# Patient Record
Sex: Female | Born: 1965 | Race: White | Hispanic: No | Marital: Married | Smoking: Never smoker
Health system: Southern US, Community
[De-identification: ages and names within clinical notes are randomized; demographics above are authoritative.]

## PROBLEM LIST (undated history)

## (undated) DIAGNOSIS — E041 Nontoxic single thyroid nodule: Secondary | ICD-10-CM

## (undated) HISTORY — PX: TONSILLECTOMY: SUR1361

## (undated) HISTORY — PX: COLONOSCOPY: SHX174

---

## 2013-06-22 ENCOUNTER — Other Ambulatory Visit (HOSPITAL_COMMUNITY)
Admission: RE | Admit: 2013-06-22 | Discharge: 2013-06-22 | Disposition: A | Discharge status: Home / Self Care | Payer: BC Managed Care – PPO | Source: Ambulatory Visit | Attending: Family Medicine | Admitting: Family Medicine

## 2013-06-22 DIAGNOSIS — Z1151 Encounter for screening for human papillomavirus (HPV): Secondary | ICD-10-CM | POA: Insufficient documentation

## 2013-06-22 DIAGNOSIS — Z124 Encounter for screening for malignant neoplasm of cervix: Secondary | ICD-10-CM | POA: Insufficient documentation

## 2013-06-27 ENCOUNTER — Other Ambulatory Visit: Payer: Self-pay

## 2013-06-27 DIAGNOSIS — Z1231 Encounter for screening mammogram for malignant neoplasm of breast: Secondary | ICD-10-CM

## 2013-07-05 ENCOUNTER — Ambulatory Visit
Admission: RE | Admit: 2013-07-05 | Discharge: 2013-07-05 | Disposition: A | Discharge status: Home / Self Care | Payer: BC Managed Care – PPO | Source: Ambulatory Visit

## 2013-07-05 DIAGNOSIS — Z1231 Encounter for screening mammogram for malignant neoplasm of breast: Secondary | ICD-10-CM

## 2013-07-07 ENCOUNTER — Other Ambulatory Visit: Payer: Self-pay | Admitting: Family Medicine

## 2013-07-07 DIAGNOSIS — R928 Other abnormal and inconclusive findings on diagnostic imaging of breast: Secondary | ICD-10-CM

## 2013-07-19 ENCOUNTER — Ambulatory Visit
Admission: RE | Admit: 2013-07-19 | Discharge: 2013-07-19 | Disposition: A | Discharge status: Home / Self Care | Payer: BC Managed Care – PPO | Source: Ambulatory Visit | Attending: Family Medicine | Admitting: Family Medicine

## 2013-07-19 ENCOUNTER — Encounter (INDEPENDENT_AMBULATORY_CARE_PROVIDER_SITE_OTHER): Payer: Self-pay

## 2013-07-19 ENCOUNTER — Ambulatory Visit
Admission: RE | Admit: 2013-07-19 | Discharge: 2013-07-19 | Disposition: A | Discharge status: Home / Self Care | Payer: Self-pay | Source: Ambulatory Visit | Attending: Family Medicine | Admitting: Family Medicine

## 2013-07-19 DIAGNOSIS — R928 Other abnormal and inconclusive findings on diagnostic imaging of breast: Secondary | ICD-10-CM

## 2013-12-19 ENCOUNTER — Other Ambulatory Visit: Payer: Self-pay | Admitting: Family Medicine

## 2013-12-19 DIAGNOSIS — N6489 Other specified disorders of breast: Secondary | ICD-10-CM

## 2014-01-23 ENCOUNTER — Ambulatory Visit
Admission: RE | Admit: 2014-01-23 | Discharge: 2014-01-23 | Disposition: A | Discharge status: Home / Self Care | Payer: 59 | Source: Ambulatory Visit | Attending: Family Medicine | Admitting: Family Medicine

## 2014-01-23 DIAGNOSIS — N6489 Other specified disorders of breast: Secondary | ICD-10-CM

## 2014-07-11 ENCOUNTER — Other Ambulatory Visit: Payer: Self-pay

## 2014-07-11 DIAGNOSIS — Z1231 Encounter for screening mammogram for malignant neoplasm of breast: Secondary | ICD-10-CM

## 2014-07-27 ENCOUNTER — Ambulatory Visit
Admission: RE | Admit: 2014-07-27 | Discharge: 2014-07-27 | Disposition: A | Discharge status: Home / Self Care | Payer: 59 | Source: Ambulatory Visit

## 2014-07-27 DIAGNOSIS — Z1231 Encounter for screening mammogram for malignant neoplasm of breast: Secondary | ICD-10-CM

## 2015-03-19 ENCOUNTER — Encounter: Payer: Self-pay | Admitting: Family Medicine

## 2015-03-19 ENCOUNTER — Ambulatory Visit (INDEPENDENT_AMBULATORY_CARE_PROVIDER_SITE_OTHER): Payer: Commercial Managed Care - HMO | Admitting: Family Medicine

## 2015-03-19 VITALS — BP 126/86 | HR 103 | Ht 66.0 in | Wt 218.0 lb

## 2015-03-19 DIAGNOSIS — M545 Low back pain, unspecified: Secondary | ICD-10-CM

## 2015-03-19 MED ORDER — METHOCARBAMOL 500 MG PO TABS: 500.0000 mg | ORAL_TABLET | Freq: Three times a day (TID) | ORAL | Status: DC | PRN

## 2015-03-19 NOTE — Patient Instructions (Signed)
While you have some SI joint tenderness I suspect the main problem is deep lumbar strain (erector spinae) based on your exam. Can take tylenol for baseline pain relief (1-2 extra strength tabs 3x/day) Aleve 2 tabs twice a day with food for pain and inflammation. Robaxin as needed for muscle spasms (no driving on this medicine if it makes you sleepy). Stay as active as possible. Physical therapy has been shown to be helpful as well - start this. Strengthening of low back muscles, abdominal musculature are key for long term pain relief. Ok to continue with your home stretches though if certain ones are too painful refrain from these for now. If not improving, will consider further imaging (MRI). Call me in 1 week to let me know how you're doing.

## 2015-03-20 DIAGNOSIS — M545 Low back pain, unspecified: Secondary | ICD-10-CM | POA: Insufficient documentation

## 2015-03-20 DIAGNOSIS — S39012A Strain of muscle, fascia and tendon of lower back, initial encounter: Secondary | ICD-10-CM | POA: Insufficient documentation

## 2015-03-20 NOTE — Progress Notes (Signed)
PCP: No primary care provider on file.  Subjective:   HPI: Patient is a 49 y.o. female here for low back pain.  Patient reports on 12/18 she started to get low back pain. No known injury or trauma. Has had very mild problems with low back for 20 years but not required any medical care for it. Felt tight on Saturday when she got a massage. Worsened since that time. Pain diffuse, right sided, sharp. Pain level 8/10 at worst. No skin changes, fever. No bowel/bladder dysfunction. No numbness, tingling.  No past medical history on file.  No current outpatient prescriptions on file prior to visit.   No current facility-administered medications on file prior to visit.    No past surgical history on file.  Allergies  Allergen Reactions  . Penicillins     Social History   Social History  . Marital Status: Married    Spouse Name: N/A  . Number of Children: N/A  . Years of Education: N/A   Occupational History  . Not on file.   Social History Main Topics  . Smoking status: Never Smoker   . Smokeless tobacco: Not on file  . Alcohol Use: Not on file  . Drug Use: Not on file  . Sexual Activity: Not on file   Other Topics Concern  . Not on file   Social History Narrative  . No narrative on file    No family history on file.  BP 126/86 mmHg  Pulse 103  Ht 5\' 6"  (1.676 m)  Wt 218 lb (98.884 kg)  BMI 35.20 kg/m2  Review of Systems: See HPI above.    Objective:  Physical Exam:  Gen: NAD  Back: No gross deformity, scoliosis. TTP right SI joint only. No paraspinal tenderness.  No midline or bony TTP. FROM with mild pain on extension > flexion. Strength LEs 5/5 all muscle groups.   2+ MSRs in patellar and achilles tendons, equal bilaterally. Negative SLRs. Sensation intact to light touch bilaterally.  Bilateral hips: Negative logroll bilateral hips Negative fabers and piriformis stretches.    Assessment & Plan:  1. Low back pain - No radiculopathic  symptoms.  No red flags.  Consistent with deep lumbar strain.  Less likely disc bulge, sprain.  Discussed tylenol, nsaids, start robaxin as needed.  Start physical therapy and home exercises.  Consider MRI if not improving.

## 2015-03-20 NOTE — Assessment & Plan Note (Signed)
No radiculopathic symptoms.  No red flags.  Consistent with deep lumbar strain.  Less likely disc bulge, sprain.  Discussed tylenol, nsaids, start robaxin as needed.  Start physical therapy and home exercises.  Consider MRI if not improving.

## 2015-06-19 ENCOUNTER — Other Ambulatory Visit: Payer: Self-pay

## 2015-06-19 DIAGNOSIS — Z1231 Encounter for screening mammogram for malignant neoplasm of breast: Secondary | ICD-10-CM

## 2015-08-01 ENCOUNTER — Ambulatory Visit: Payer: Commercial Managed Care - HMO

## 2015-08-20 ENCOUNTER — Ambulatory Visit
Admission: RE | Admit: 2015-08-20 | Discharge: 2015-08-20 | Disposition: A | Discharge status: Home / Self Care | Payer: Commercial Managed Care - HMO | Source: Ambulatory Visit

## 2015-08-20 DIAGNOSIS — Z1231 Encounter for screening mammogram for malignant neoplasm of breast: Secondary | ICD-10-CM

## 2016-07-14 ENCOUNTER — Other Ambulatory Visit: Payer: Self-pay | Admitting: Family Medicine

## 2016-07-14 DIAGNOSIS — Z1231 Encounter for screening mammogram for malignant neoplasm of breast: Secondary | ICD-10-CM

## 2016-08-20 ENCOUNTER — Ambulatory Visit: Payer: Commercial Managed Care - HMO

## 2016-09-09 ENCOUNTER — Ambulatory Visit
Admission: RE | Admit: 2016-09-09 | Discharge: 2016-09-09 | Disposition: A | Discharge status: Home / Self Care | Payer: Commercial Managed Care - HMO | Source: Ambulatory Visit | Attending: Family Medicine | Admitting: Family Medicine

## 2016-09-09 DIAGNOSIS — Z1231 Encounter for screening mammogram for malignant neoplasm of breast: Secondary | ICD-10-CM

## 2017-06-09 DIAGNOSIS — L570 Actinic keratosis: Secondary | ICD-10-CM | POA: Diagnosis not present

## 2017-06-09 DIAGNOSIS — D225 Melanocytic nevi of trunk: Secondary | ICD-10-CM | POA: Diagnosis not present

## 2017-06-09 DIAGNOSIS — L814 Other melanin hyperpigmentation: Secondary | ICD-10-CM | POA: Diagnosis not present

## 2017-06-09 DIAGNOSIS — D1801 Hemangioma of skin and subcutaneous tissue: Secondary | ICD-10-CM | POA: Diagnosis not present

## 2017-07-27 DIAGNOSIS — Z6836 Body mass index (BMI) 36.0-36.9, adult: Secondary | ICD-10-CM | POA: Diagnosis not present

## 2017-07-27 DIAGNOSIS — R946 Abnormal results of thyroid function studies: Secondary | ICD-10-CM | POA: Diagnosis not present

## 2017-07-27 DIAGNOSIS — Z800 Family history of malignant neoplasm of digestive organs: Secondary | ICD-10-CM | POA: Diagnosis not present

## 2017-07-27 DIAGNOSIS — J3089 Other allergic rhinitis: Secondary | ICD-10-CM | POA: Diagnosis not present

## 2017-07-27 DIAGNOSIS — Z131 Encounter for screening for diabetes mellitus: Secondary | ICD-10-CM | POA: Diagnosis not present

## 2017-07-27 DIAGNOSIS — Z0000 Encounter for general adult medical examination without abnormal findings: Secondary | ICD-10-CM | POA: Diagnosis not present

## 2017-08-03 ENCOUNTER — Other Ambulatory Visit: Payer: Self-pay | Admitting: Family Medicine

## 2017-08-03 DIAGNOSIS — Z1231 Encounter for screening mammogram for malignant neoplasm of breast: Secondary | ICD-10-CM

## 2017-09-15 ENCOUNTER — Ambulatory Visit
Admission: RE | Admit: 2017-09-15 | Discharge: 2017-09-15 | Disposition: A | Discharge status: Home / Self Care | Payer: BLUE CROSS/BLUE SHIELD | Source: Ambulatory Visit | Attending: Family Medicine | Admitting: Family Medicine

## 2017-09-15 DIAGNOSIS — Z1231 Encounter for screening mammogram for malignant neoplasm of breast: Secondary | ICD-10-CM | POA: Diagnosis not present

## 2018-08-08 ENCOUNTER — Other Ambulatory Visit: Payer: Self-pay | Admitting: Family Medicine

## 2018-08-08 DIAGNOSIS — Z1231 Encounter for screening mammogram for malignant neoplasm of breast: Secondary | ICD-10-CM

## 2018-09-29 ENCOUNTER — Other Ambulatory Visit: Payer: Self-pay

## 2018-09-29 ENCOUNTER — Ambulatory Visit
Admission: RE | Admit: 2018-09-29 | Discharge: 2018-09-29 | Disposition: A | Discharge status: Home / Self Care | Payer: 59 | Source: Ambulatory Visit | Attending: Family Medicine | Admitting: Family Medicine

## 2018-09-29 DIAGNOSIS — Z1231 Encounter for screening mammogram for malignant neoplasm of breast: Secondary | ICD-10-CM

## 2019-10-18 ENCOUNTER — Other Ambulatory Visit: Payer: Self-pay | Admitting: Obstetrics & Gynecology

## 2019-10-18 DIAGNOSIS — Z1231 Encounter for screening mammogram for malignant neoplasm of breast: Secondary | ICD-10-CM

## 2019-11-08 ENCOUNTER — Other Ambulatory Visit: Payer: Self-pay

## 2019-11-08 ENCOUNTER — Ambulatory Visit
Admission: RE | Admit: 2019-11-08 | Discharge: 2019-11-08 | Disposition: A | Discharge status: Home / Self Care | Payer: 59 | Source: Ambulatory Visit | Attending: Obstetrics & Gynecology | Admitting: Obstetrics & Gynecology

## 2019-11-08 DIAGNOSIS — Z1231 Encounter for screening mammogram for malignant neoplasm of breast: Secondary | ICD-10-CM

## 2020-09-27 ENCOUNTER — Other Ambulatory Visit: Payer: Self-pay | Admitting: Family Medicine

## 2020-09-27 DIAGNOSIS — Z1231 Encounter for screening mammogram for malignant neoplasm of breast: Secondary | ICD-10-CM

## 2020-11-11 ENCOUNTER — Other Ambulatory Visit: Payer: Self-pay | Admitting: Family Medicine

## 2020-11-11 DIAGNOSIS — R221 Localized swelling, mass and lump, neck: Secondary | ICD-10-CM

## 2020-11-13 ENCOUNTER — Ambulatory Visit (HOSPITAL_BASED_OUTPATIENT_CLINIC_OR_DEPARTMENT_OTHER): Admission: RE | Admit: 2020-11-13 | Payer: 59 | Source: Ambulatory Visit

## 2020-11-20 ENCOUNTER — Encounter (HOSPITAL_BASED_OUTPATIENT_CLINIC_OR_DEPARTMENT_OTHER): Payer: Self-pay

## 2020-11-20 ENCOUNTER — Ambulatory Visit (HOSPITAL_BASED_OUTPATIENT_CLINIC_OR_DEPARTMENT_OTHER)
Admission: RE | Admit: 2020-11-20 | Discharge: 2020-11-20 | Disposition: A | Discharge status: Home / Self Care | Payer: No Typology Code available for payment source | Source: Ambulatory Visit | Attending: Family Medicine | Admitting: Family Medicine

## 2020-11-20 ENCOUNTER — Other Ambulatory Visit: Payer: Self-pay

## 2020-11-20 DIAGNOSIS — R221 Localized swelling, mass and lump, neck: Secondary | ICD-10-CM

## 2020-11-20 MED ORDER — IOHEXOL 300 MG/ML  SOLN
75.0000 mL | Freq: Once | INTRAMUSCULAR | Status: AC | PRN
  Administered 2020-11-20: 75 mL via INTRAVENOUS

## 2020-11-26 ENCOUNTER — Other Ambulatory Visit (HOSPITAL_COMMUNITY): Payer: Self-pay | Admitting: Family Medicine

## 2020-11-26 DIAGNOSIS — E079 Disorder of thyroid, unspecified: Secondary | ICD-10-CM

## 2020-11-27 ENCOUNTER — Other Ambulatory Visit: Payer: Self-pay

## 2020-11-27 ENCOUNTER — Ambulatory Visit
Admission: RE | Admit: 2020-11-27 | Discharge: 2020-11-27 | Disposition: A | Discharge status: Home / Self Care | Payer: No Typology Code available for payment source | Source: Ambulatory Visit | Attending: Family Medicine | Admitting: Family Medicine

## 2020-11-27 DIAGNOSIS — Z1231 Encounter for screening mammogram for malignant neoplasm of breast: Secondary | ICD-10-CM

## 2020-12-04 ENCOUNTER — Other Ambulatory Visit: Payer: Self-pay

## 2020-12-04 ENCOUNTER — Ambulatory Visit (HOSPITAL_BASED_OUTPATIENT_CLINIC_OR_DEPARTMENT_OTHER)
Admission: RE | Admit: 2020-12-04 | Discharge: 2020-12-04 | Disposition: A | Discharge status: Home / Self Care | Payer: BC Managed Care – PPO | Source: Ambulatory Visit | Attending: Family Medicine | Admitting: Family Medicine

## 2020-12-04 DIAGNOSIS — E042 Nontoxic multinodular goiter: Secondary | ICD-10-CM | POA: Diagnosis not present

## 2020-12-04 DIAGNOSIS — E079 Disorder of thyroid, unspecified: Secondary | ICD-10-CM | POA: Diagnosis not present

## 2020-12-11 DIAGNOSIS — Z01419 Encounter for gynecological examination (general) (routine) without abnormal findings: Secondary | ICD-10-CM | POA: Diagnosis not present

## 2020-12-11 DIAGNOSIS — N952 Postmenopausal atrophic vaginitis: Secondary | ICD-10-CM | POA: Diagnosis not present

## 2020-12-11 DIAGNOSIS — Z6836 Body mass index (BMI) 36.0-36.9, adult: Secondary | ICD-10-CM | POA: Diagnosis not present

## 2021-01-03 ENCOUNTER — Encounter (HOSPITAL_COMMUNITY): Payer: Self-pay | Admitting: Radiology

## 2021-01-09 ENCOUNTER — Other Ambulatory Visit: Payer: Self-pay | Admitting: Family Medicine

## 2021-01-09 DIAGNOSIS — E041 Nontoxic single thyroid nodule: Secondary | ICD-10-CM

## 2021-01-30 ENCOUNTER — Ambulatory Visit
Admission: RE | Admit: 2021-01-30 | Discharge: 2021-01-30 | Disposition: A | Discharge status: Home / Self Care | Payer: BC Managed Care – PPO | Source: Ambulatory Visit | Attending: Family Medicine | Admitting: Family Medicine

## 2021-01-30 ENCOUNTER — Other Ambulatory Visit (HOSPITAL_COMMUNITY)
Admission: RE | Admit: 2021-01-30 | Discharge: 2021-01-30 | Disposition: A | Discharge status: Home / Self Care | Payer: BC Managed Care – PPO | Source: Ambulatory Visit | Attending: Family Medicine | Admitting: Family Medicine

## 2021-01-30 DIAGNOSIS — D34 Benign neoplasm of thyroid gland: Secondary | ICD-10-CM | POA: Diagnosis not present

## 2021-01-30 DIAGNOSIS — E042 Nontoxic multinodular goiter: Secondary | ICD-10-CM | POA: Diagnosis not present

## 2021-01-30 DIAGNOSIS — E041 Nontoxic single thyroid nodule: Secondary | ICD-10-CM | POA: Diagnosis not present

## 2021-01-30 DIAGNOSIS — D440 Neoplasm of uncertain behavior of thyroid gland: Secondary | ICD-10-CM | POA: Diagnosis not present

## 2021-01-31 LAB — CYTOLOGY - NON PAP

## 2021-02-04 ENCOUNTER — Other Ambulatory Visit: Payer: Self-pay | Admitting: Radiology

## 2021-03-10 DIAGNOSIS — D123 Benign neoplasm of transverse colon: Secondary | ICD-10-CM | POA: Diagnosis not present

## 2021-03-10 DIAGNOSIS — Z86010 Personal history of colonic polyps: Secondary | ICD-10-CM | POA: Diagnosis not present

## 2021-03-10 DIAGNOSIS — K5730 Diverticulosis of large intestine without perforation or abscess without bleeding: Secondary | ICD-10-CM | POA: Diagnosis not present

## 2021-03-10 DIAGNOSIS — D122 Benign neoplasm of ascending colon: Secondary | ICD-10-CM | POA: Diagnosis not present

## 2021-03-12 ENCOUNTER — Ambulatory Visit: Payer: Self-pay | Admitting: Surgery

## 2021-03-12 DIAGNOSIS — D440 Neoplasm of uncertain behavior of thyroid gland: Secondary | ICD-10-CM | POA: Diagnosis not present

## 2021-03-12 DIAGNOSIS — E042 Nontoxic multinodular goiter: Secondary | ICD-10-CM | POA: Diagnosis not present

## 2021-04-01 NOTE — Patient Instructions (Addendum)
DUE TO COVID-19 ONLY ONE VISITOR IS ALLOWED TO COME WITH YOU AND STAY IN THE WAITING ROOM ONLY DURING PRE OP AND PROCEDURE.   **NO VISITORS ARE ALLOWED IN THE SHORT STAY AREA OR RECOVERY ROOM!!**  IF YOU WILL BE ADMITTED INTO THE HOSPITAL YOU ARE ALLOWED ONLY TWO SUPPORT PEOPLE DURING VISITATION HOURS ONLY (10AM -8PM)   The support person(s) may change daily. The support person(s) must pass our screening, gel in and out, and wear a mask at all times, including in the patients room. Patients must also wear a mask when staff or their support person are in the room.  No visitors under the age of 89. Any visitor under the age of 66 must be accompanied by an adult.    COVID SWAB TESTING MUST BE COMPLETED ON:  04/02/21 @ 8:45AM  You are not required to quarantine, however you are required to wear a well-fitted mask when you are out and around people not in your household.  Hand Hygiene often Do NOT share personal items Notify your provider if you are in close contact with someone who has COVID or you develop fever 100.4 or greater, new onset of sneezing, cough, sore throat, shortness of breath or body aches.   Your procedure is scheduled on: 04/04/21   Report to [facility] Main Entrance    Report to admitting at 7:15 AM   Call this number if you have problems the morning of surgery [phone]   Do not eat food :After Midnight.   May have liquids until 6:30 AM day of surgery  CLEAR LIQUID DIET  Foods Allowed                                                                     Foods Excluded  Water, Black Coffee and tea (no milk or creamer)           liquids that you cannot  Plain Jell-O in any flavor  (No red)                                    see through such as: Fruit ices (not with fruit pulp)                                            milk, soups, orange juice              Iced Popsicles (No red)                                                All solid food                                    Apple juices     Sports drinks like Gatorade (No red) Lightly seasoned clear broth or consume(fat free) Sugar   Oral Hygiene is  also important to reduce your risk of infection.                                    Remember - BRUSH YOUR TEETH THE MORNING OF SURGERY WITH YOUR REGULAR TOOTHPASTE   Take these medicines the morning of surgery with A SIP OF WATER: Claritin                               You may not have any metal on your body including jewelry, and body piercing             Do not wear make-up, lotions, powders, perfumes, or deodorant  Do not wear nail polish including gel and S&S, artificial/acrylic nails, or any other type of covering on natural nails including finger and toenails. If you have artificial nails, gel coating, etc. that needs to be removed by a nail salon please have this removed prior to surgery or surgery may need to be canceled/ delayed if the surgeon/ anesthesia feels like they are unable to be safely monitored.   Do not shave  48 hours prior to surgery.    Do not bring valuables to the hospital. [place].   Contacts, dentures or bridgework may not be worn into surgery.   Bring small overnight bag day of surgery.   Special Instructions: Bring a copy of your healthcare power of attorney and living will documents         the day of surgery if you haven't scanned them before.              Please read over the following fact sheets you were given: IF YOU HAVE QUESTIONS ABOUT YOUR PRE-OP INSTRUCTIONS PLEASE CALL [facility] - Preparing for Surgery Before surgery, you can play an important role.  Because skin is not sterile, your skin needs to be as free of germs as possible.  You can reduce the number of germs on your skin by washing with CHG (chlorahexidine gluconate) soap before surgery.  CHG is an antiseptic cleaner which kills germs and bonds with the skin to continue  killing germs even after washing. Please DO NOT use if you have an allergy to CHG or antibacterial soaps.  If your skin becomes reddened/irritated stop using the CHG and inform your nurse when you arrive at Short Stay. Do not shave (including legs and underarms) for at least 48 hours prior to the first CHG shower.  You may shave your face/neck.  Please follow these instructions carefully:  1.  Shower with CHG Soap the night before surgery and the  morning of surgery.  2.  If you choose to wash your hair, wash your hair first as usual with your normal  shampoo.  3.  After you shampoo, rinse your hair and body thoroughly to remove the shampoo.                             4.  Use CHG as you would any other liquid soap.  You can apply chg directly to the skin and wash.  Gently with a scrungie or clean washcloth.  5.  Apply the CHG Soap to your body  ONLY FROM THE NECK DOWN.   Do   not use on face/ open                           Wound or open sores. Avoid contact with eyes, ears mouth and   genitals (private parts).                       Wash face,  Genitals (private parts) with your normal soap.             6.  Wash thoroughly, paying special attention to the area where your    surgery  will be performed.  7.  Thoroughly rinse your body with warm water from the neck down.  8.  DO NOT shower/wash with your normal soap after using and rinsing off the CHG Soap.                9.  Pat yourself dry with a clean towel.            10.  Wear clean pajamas.            11.  Place clean sheets on your bed the night of your first shower and do not  sleep with pets. Day of Surgery : Do not apply any lotions/deodorants the morning of surgery.  Please wear clean clothes to the hospital/surgery center.  FAILURE TO FOLLOW THESE INSTRUCTIONS MAY RESULT IN THE CANCELLATION OF YOUR SURGERY  PATIENT SIGNATURE_________________________________  NURSE  SIGNATURE__________________________________  ________________________________________________________________________

## 2021-04-01 NOTE — Progress Notes (Addendum)
COVID swab appointment: 04/02/21 @ 0845  COVID Vaccine Completed: yes x3 Date COVID Vaccine completed: Has received booster: COVID vaccine manufacturer: [employer]   Date of COVID positive in last 90 days: no  PCP - Kathyrn Lass, MD Cardiologist - n/a  Chest x-ray - 04/02/21 Epic EKG - n/a Stress Test -  n/a ECHO - n/a Cardiac Cath - n/a Pacemaker/ICD device last checked: n/a Spinal Cord Stimulator: n/a  Sleep Study - n/a CPAP -   Fasting Blood Sugar - n/a Checks Blood Sugar _____ times a day  Blood Thinner Instructions: n/a Aspirin Instructions: Last Dose:  Activity level: Can go up a flight of stairs and perform activities of daily living without stopping and without symptoms of chest pain or shortness of breath.       Anesthesia review:   Patient denies shortness of breath, fever, cough and chest pain at PAT appointment   Patient verbalized understanding of instructions that were given to them at the PAT appointment. Patient was also instructed that they will need to review over the PAT instructions again at home before surgery.

## 2021-04-02 ENCOUNTER — Encounter (HOSPITAL_COMMUNITY): Payer: Self-pay

## 2021-04-02 ENCOUNTER — Other Ambulatory Visit: Payer: Self-pay

## 2021-04-02 ENCOUNTER — Ambulatory Visit (HOSPITAL_COMMUNITY)
Admission: RE | Admit: 2021-04-02 | Discharge: 2021-04-02 | Disposition: A | Discharge status: Home / Self Care | Payer: BC Managed Care – PPO | Source: Ambulatory Visit | Attending: Anesthesiology | Admitting: Anesthesiology

## 2021-04-02 ENCOUNTER — Encounter (HOSPITAL_COMMUNITY): Payer: Self-pay | Admitting: Surgery

## 2021-04-02 ENCOUNTER — Encounter (HOSPITAL_COMMUNITY)
Admission: RE | Admit: 2021-04-02 | Discharge: 2021-04-02 | Disposition: A | Discharge status: Home / Self Care | Payer: BC Managed Care – PPO | Source: Ambulatory Visit | Attending: Surgery | Admitting: Surgery

## 2021-04-02 VITALS — BP 134/91 | HR 97 | Temp 98.5°F | Resp 18 | Ht 66.0 in | Wt 225.0 lb

## 2021-04-02 DIAGNOSIS — Z01818 Encounter for other preprocedural examination: Secondary | ICD-10-CM

## 2021-04-02 DIAGNOSIS — D440 Neoplasm of uncertain behavior of thyroid gland: Secondary | ICD-10-CM | POA: Diagnosis present

## 2021-04-02 DIAGNOSIS — Z20822 Contact with and (suspected) exposure to covid-19: Secondary | ICD-10-CM | POA: Diagnosis not present

## 2021-04-02 DIAGNOSIS — E042 Nontoxic multinodular goiter: Secondary | ICD-10-CM | POA: Diagnosis present

## 2021-04-02 HISTORY — DX: Nontoxic single thyroid nodule: E04.1

## 2021-04-02 LAB — SARS CORONAVIRUS 2 (TAT 6-24 HRS): SARS Coronavirus 2: NEGATIVE

## 2021-04-02 LAB — CBC
HCT: 40.3 % (ref 36.0–46.0)
Hemoglobin: 14.2 g/dL (ref 12.0–15.0)
MCH: 32 pg (ref 26.0–34.0)
MCHC: 35.2 g/dL (ref 30.0–36.0)
MCV: 90.8 fL (ref 80.0–100.0)
Platelets: 324 10*3/uL (ref 150–400)
RBC: 4.44 MIL/uL (ref 3.87–5.11)
RDW: 13.2 % (ref 11.5–15.5)
WBC: 9.2 10*3/uL (ref 4.0–10.5)
nRBC: 0 % (ref 0.0–0.2)

## 2021-04-02 NOTE — H&P (Signed)
REFERRING PHYSICIAN: Kathyrn Lass  PROVIDER: Dixie Jafri Charlotta Newton, MD  Chief Complaint: New Consultation (Thyroid neoplasm of uncertain behavior, multiple thyroid nodules)   History of Present Illness:  Patient is referred by Dr. 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 Patient had noted in July 2020 to a mass in the left anterior neck on self-examination. She presented to her primary care physician and was sent for CT scan. This demonstrated multiple thyroid nodules. Patient underwent thyroid ultrasound on December 04, 2020. Bilateral thyroid nodules were noted. Several of these nodules were felt to be moderately suspicious. Fine-needle aspiration biopsies were performed on January 30, 2021. The dominant nodule on the left side measuring 3.3 cm was read as suspicious for malignancy, Bethesda category V. Patient has no prior history of thyroid disease. She had never been on thyroid medication. She has had no prior head or neck surgery. There is a family history of thyroid disease in the patient's mother who underwent surgery for goiter. There is no family history of other endocrine neoplasms. Patient works as a Marine scientist in [employer].  Review of Systems: A complete review of systems was obtained from the patient. I have reviewed this information and discussed as appropriate with the patient. See HPI as well for other ROS.  Review of Systems  Constitutional: Negative.  HENT: Negative.  Eyes: Negative.  Respiratory: Negative.  Cardiovascular: Negative.  Gastrointestinal: Negative.  Genitourinary: Negative.  Musculoskeletal: Negative.  Skin: Negative.  Neurological: Negative.  Endo/Heme/Allergies: Negative.  Psychiatric/Behavioral: Negative.    Medical History: No past medical history on file.  Patient Active Problem List  Diagnosis   Neoplasm of uncertain behavior of  thyroid gland   Multiple thyroid nodules   No past surgical history on file.   Not on File  No current outpatient medications on file prior to visit.   No current facility-administered medications on file prior to visit.   No family history on file.   Social History   Tobacco Use  Smoking Status Not on file  Smokeless Tobacco Not on file    Social History   Socioeconomic History   Marital status: Married   Objective:   There were no vitals filed for this visit.  There is no height or weight on file to calculate BMI.  Physical Exam   GENERAL APPEARANCE Development: normal Nutritional status: normal Gross deformities: none  SKIN Rash, lesions, ulcers: none Induration, erythema: none Nodules: none palpable  EYES Conjunctiva and lids: normal Pupils: equal and reactive Iris: normal bilaterally  EARS, NOSE, MOUTH, THROAT External ears: no lesion or deformity External nose: no lesion or deformity Hearing: grossly normal Due to Covid-19 pandemic, patient is wearing a mask.  NECK Symmetric: yes Trachea: midline Thyroid: There is a palpable 2 cm smooth nodule in the thyroid isthmus which is mobile. There is no palpable nodularity in the right thyroid lobe. Dominant nodule on the left side is palpable low in the left neck and extending beneath the left clavicle. There is no associated lymphadenopathy.  CHEST Respiratory effort: normal Retraction or accessory muscle use: no Breath sounds: normal bilaterally Rales, rhonchi, wheeze: none  CARDIOVASCULAR Auscultation: regular rhythm, normal rate Murmurs: none Pulses: radial pulse 2+ palpable Lower extremity edema: none  MUSCULOSKELETAL Station and gait: normal Digits and nails: no clubbing or cyanosis Muscle strength: grossly normal all extremities Range of motion: grossly normal all extremities Deformity: none  LYMPHATIC  Cervical: none palpable Supraclavicular: none palpable  PSYCHIATRIC Oriented to  person, place, and time: yes Mood and affect: normal for situation Judgment and insight: appropriate for situation  Assessment and Plan:   Neoplasm of uncertain behavior of thyroid gland  Multiple thyroid nodules    Patient presents today on referral from Dr. Kathyrn Lass for surgical evaluation and management of multiple thyroid nodules and a neoplasm of uncertain behavior of the thyroid gland which is highly suspicious for malignancy.  Patient provided with a copy of "The Thyroid Book: Medical and Surgical Treatment of Thyroid Problems", published by [employer], 16 pages. Book reviewed and explained to patient during visit today.  Today we discussed management. Based on bilateral thyroid nodules and the presence of a highly suspicious nodule with approximately a 90% chance of malignancy, I have recommended proceeding with total thyroidectomy. We discussed the procedure at length. We discussed the risk of benefits including the risk of recurrent laryngeal nerve injury and injury to parathyroid glands. We discussed the size and location of the surgical incision. We discussed the hospital stay to be anticipated. We discussed the postoperative recovery and return to work and activities. We discussed the need for lifelong thyroid hormone replacement. We discussed the potential need for consultation with endocrinology and radioactive iodine treatment. The patient understands and wishes to proceed with surgery in the near future.  The risks and benefits of the procedure have been discussed at length with the patient. The patient understands the proposed procedure, potential alternative treatments, and the course of recovery to be expected. All of the patient's questions have been answered at this time. The patient wishes to proceed with surgery.   Armandina Gemma, MD [facility] [place] practice Office: [phone]

## 2021-04-03 ENCOUNTER — Encounter (HOSPITAL_COMMUNITY): Payer: Self-pay | Admitting: Surgery

## 2021-04-04 ENCOUNTER — Encounter (HOSPITAL_COMMUNITY): Payer: Self-pay | Admitting: Surgery

## 2021-04-04 ENCOUNTER — Other Ambulatory Visit: Payer: Self-pay

## 2021-04-04 ENCOUNTER — Ambulatory Visit (HOSPITAL_COMMUNITY): Payer: BC Managed Care – PPO | Admitting: Anesthesiology

## 2021-04-04 ENCOUNTER — Ambulatory Visit (HOSPITAL_COMMUNITY)
Admission: RE | Admit: 2021-04-04 | Discharge: 2021-04-05 | Disposition: A | Discharge status: Home / Self Care | Payer: BC Managed Care – PPO | Attending: Surgery | Admitting: Surgery

## 2021-04-04 ENCOUNTER — Encounter (HOSPITAL_COMMUNITY)
Admission: RE | Disposition: A | Discharge status: Home / Self Care | Payer: Self-pay | Source: Home / Self Care | Attending: Surgery

## 2021-04-04 DIAGNOSIS — D34 Benign neoplasm of thyroid gland: Secondary | ICD-10-CM | POA: Insufficient documentation

## 2021-04-04 DIAGNOSIS — E041 Nontoxic single thyroid nodule: Secondary | ICD-10-CM | POA: Diagnosis not present

## 2021-04-04 DIAGNOSIS — E042 Nontoxic multinodular goiter: Secondary | ICD-10-CM | POA: Insufficient documentation

## 2021-04-04 DIAGNOSIS — D440 Neoplasm of uncertain behavior of thyroid gland: Secondary | ICD-10-CM | POA: Diagnosis not present

## 2021-04-04 DIAGNOSIS — E669 Obesity, unspecified: Secondary | ICD-10-CM | POA: Insufficient documentation

## 2021-04-04 DIAGNOSIS — Z6836 Body mass index (BMI) 36.0-36.9, adult: Secondary | ICD-10-CM | POA: Diagnosis not present

## 2021-04-04 HISTORY — PX: THYROIDECTOMY: SHX17

## 2021-04-04 SURGERY — THYROIDECTOMY
Anesthesia: General

## 2021-04-04 MED ORDER — IBUPROFEN 400 MG PO TABS: 600.0000 mg | ORAL_TABLET | Freq: Four times a day (QID) | ORAL | Status: DC | PRN

## 2021-04-04 MED ORDER — HEMOSTATIC AGENTS (NO CHARGE) OPTIME
TOPICAL | Status: DC | PRN
  Administered 2021-04-04: 1 via TOPICAL

## 2021-04-04 MED ORDER — ACETAMINOPHEN 650 MG RE SUPP: 650.0000 mg | Freq: Four times a day (QID) | RECTAL | Status: DC | PRN

## 2021-04-04 MED ORDER — ONDANSETRON HCL 4 MG/2ML IJ SOLN
INTRAMUSCULAR | Status: DC | PRN
Start: 2021-04-04 — End: 2021-04-04
  Administered 2021-04-04: 4 mg via INTRAVENOUS

## 2021-04-04 MED ORDER — ACETAMINOPHEN 500 MG PO TABS
1000.0000 mg | ORAL_TABLET | Freq: Once | ORAL | Status: AC
  Administered 2021-04-04: 1000 mg via ORAL
  Filled 2021-04-04: qty 2

## 2021-04-04 MED ORDER — ROCURONIUM BROMIDE 10 MG/ML (PF) SYRINGE
PREFILLED_SYRINGE | INTRAVENOUS | Status: DC | PRN
  Administered 2021-04-04: 70 mg via INTRAVENOUS
  Administered 2021-04-04: 10 mg via INTRAVENOUS

## 2021-04-04 MED ORDER — SUGAMMADEX SODIUM 500 MG/5ML IV SOLN
INTRAVENOUS | Status: DC | PRN
  Administered 2021-04-04: 25 mg via INTRAVENOUS

## 2021-04-04 MED ORDER — PROPOFOL 10 MG/ML IV BOLUS
INTRAVENOUS | Status: DC | PRN
  Administered 2021-04-04: 150 mg via INTRAVENOUS

## 2021-04-04 MED ORDER — SUGAMMADEX SODIUM 500 MG/5ML IV SOLN
INTRAVENOUS | Status: AC
  Filled 2021-04-04: qty 5

## 2021-04-04 MED ORDER — LACTATED RINGERS IV SOLN
INTRAVENOUS | Status: DC
Start: 2021-04-04 — End: 2021-04-04

## 2021-04-04 MED ORDER — ONDANSETRON 4 MG PO TBDP: 4.0000 mg | ORAL_TABLET | Freq: Four times a day (QID) | ORAL | Status: DC | PRN

## 2021-04-04 MED ORDER — TRAMADOL HCL 50 MG PO TABS: 50.0000 mg | ORAL_TABLET | Freq: Four times a day (QID) | ORAL | Status: DC | PRN

## 2021-04-04 MED ORDER — ONDANSETRON HCL 4 MG/2ML IJ SOLN
INTRAMUSCULAR | Status: AC
  Filled 2021-04-04: qty 2

## 2021-04-04 MED ORDER — MIDAZOLAM HCL 2 MG/2ML IJ SOLN
INTRAMUSCULAR | Status: AC
  Filled 2021-04-04: qty 2

## 2021-04-04 MED ORDER — CHLORHEXIDINE GLUCONATE CLOTH 2 % EX PADS: 6.0000 | MEDICATED_PAD | Freq: Once | CUTANEOUS | Status: DC

## 2021-04-04 MED ORDER — OXYCODONE HCL 5 MG PO TABS: 5.0000 mg | ORAL_TABLET | Freq: Once | ORAL | Status: DC | PRN

## 2021-04-04 MED ORDER — CHLORHEXIDINE GLUCONATE 0.12 % MT SOLN
15.0000 mL | Freq: Once | OROMUCOSAL | Status: AC
  Administered 2021-04-04: 15 mL via OROMUCOSAL

## 2021-04-04 MED ORDER — DEXAMETHASONE SODIUM PHOSPHATE 10 MG/ML IJ SOLN
INTRAMUSCULAR | Status: AC
  Filled 2021-04-04: qty 1

## 2021-04-04 MED ORDER — SODIUM CHLORIDE 0.45 % IV SOLN
INTRAVENOUS | Status: DC
Start: 2021-04-04 — End: 2021-04-05

## 2021-04-04 MED ORDER — OXYCODONE HCL 5 MG/5ML PO SOLN: 5.0000 mg | Freq: Once | ORAL | Status: DC | PRN

## 2021-04-04 MED ORDER — ACETAMINOPHEN 325 MG PO TABS
650.0000 mg | ORAL_TABLET | Freq: Four times a day (QID) | ORAL | Status: DC | PRN
  Administered 2021-04-04 – 2021-04-05 (×3): 650 mg via ORAL
  Filled 2021-04-04 (×3): qty 2

## 2021-04-04 MED ORDER — AMISULPRIDE (ANTIEMETIC) 5 MG/2ML IV SOLN: 10.0000 mg | Freq: Once | INTRAVENOUS | Status: DC | PRN

## 2021-04-04 MED ORDER — MIDAZOLAM HCL 5 MG/5ML IJ SOLN
INTRAMUSCULAR | Status: DC | PRN
  Administered 2021-04-04: 2 mg via INTRAVENOUS

## 2021-04-04 MED ORDER — FENTANYL CITRATE PF 50 MCG/ML IJ SOSY
PREFILLED_SYRINGE | INTRAMUSCULAR | Status: AC
  Administered 2021-04-04: 50 ug via INTRAVENOUS
  Filled 2021-04-04: qty 3

## 2021-04-04 MED ORDER — 0.9 % SODIUM CHLORIDE (POUR BTL) OPTIME
TOPICAL | Status: DC | PRN
  Administered 2021-04-04: 1000 mL

## 2021-04-04 MED ORDER — LIDOCAINE 2% (20 MG/ML) 5 ML SYRINGE
INTRAMUSCULAR | Status: DC | PRN
  Administered 2021-04-04: 80 mg via INTRAVENOUS

## 2021-04-04 MED ORDER — PROMETHAZINE HCL 25 MG/ML IJ SOLN: 6.2500 mg | INTRAMUSCULAR | Status: DC | PRN

## 2021-04-04 MED ORDER — OXYCODONE HCL 5 MG PO TABS
ORAL_TABLET | ORAL | Status: AC
  Filled 2021-04-04: qty 1

## 2021-04-04 MED ORDER — FENTANYL CITRATE (PF) 250 MCG/5ML IJ SOLN
INTRAMUSCULAR | Status: AC
  Filled 2021-04-04: qty 5

## 2021-04-04 MED ORDER — ONDANSETRON HCL 4 MG/2ML IJ SOLN: 4.0000 mg | Freq: Four times a day (QID) | INTRAMUSCULAR | Status: DC | PRN

## 2021-04-04 MED ORDER — SCOPOLAMINE 1 MG/3DAYS TD PT72
1.0000 | MEDICATED_PATCH | TRANSDERMAL | Status: DC
  Administered 2021-04-04: 1.5 mg via TRANSDERMAL
  Filled 2021-04-04: qty 1

## 2021-04-04 MED ORDER — HYDROMORPHONE HCL 1 MG/ML IJ SOLN: 1.0000 mg | INTRAMUSCULAR | Status: DC | PRN

## 2021-04-04 MED ORDER — ORAL CARE MOUTH RINSE: 15.0000 mL | Freq: Once | OROMUCOSAL | Status: AC

## 2021-04-04 MED ORDER — FENTANYL CITRATE PF 50 MCG/ML IJ SOSY
25.0000 ug | PREFILLED_SYRINGE | INTRAMUSCULAR | Status: DC | PRN
  Administered 2021-04-04: 50 ug via INTRAVENOUS

## 2021-04-04 MED ORDER — CIPROFLOXACIN IN D5W 400 MG/200ML IV SOLN
400.0000 mg | INTRAVENOUS | Status: AC
  Administered 2021-04-04: 400 mg via INTRAVENOUS
  Filled 2021-04-04: qty 200

## 2021-04-04 MED ORDER — DEXAMETHASONE SODIUM PHOSPHATE 10 MG/ML IJ SOLN
INTRAMUSCULAR | Status: DC | PRN
  Administered 2021-04-04: 10 mg via INTRAVENOUS

## 2021-04-04 MED ORDER — FENTANYL CITRATE (PF) 100 MCG/2ML IJ SOLN
INTRAMUSCULAR | Status: DC | PRN
  Administered 2021-04-04: 50 ug via INTRAVENOUS
  Administered 2021-04-04: 100 ug via INTRAVENOUS
  Administered 2021-04-04: 50 ug via INTRAVENOUS

## 2021-04-04 MED ORDER — OXYCODONE HCL 5 MG PO TABS: 5.0000 mg | ORAL_TABLET | ORAL | Status: DC | PRN

## 2021-04-04 MED ORDER — DEXMEDETOMIDINE (PRECEDEX) IN NS 20 MCG/5ML (4 MCG/ML) IV SYRINGE
PREFILLED_SYRINGE | INTRAVENOUS | Status: DC | PRN
  Administered 2021-04-04: 8 ug via INTRAVENOUS
  Administered 2021-04-04: 4 ug via INTRAVENOUS
  Administered 2021-04-04: 8 ug via INTRAVENOUS

## 2021-04-04 MED ORDER — CALCIUM CARBONATE 1250 (500 CA) MG PO TABS
2.0000 | ORAL_TABLET | Freq: Three times a day (TID) | ORAL | Status: DC
  Administered 2021-04-04 – 2021-04-05 (×2): 1000 mg via ORAL
  Filled 2021-04-04 (×2): qty 1

## 2021-04-04 SURGICAL SUPPLY — 31 items
ATTRACTOMAT 16X20 MAGNETIC DRP (DRAPES) ×2 IMPLANT
BAG COUNTER SPONGE SURGICOUNT (BAG) ×2 IMPLANT
BLADE SURG 15 STRL LF DISP TIS (BLADE) ×1 IMPLANT
BLADE SURG 15 STRL SS (BLADE) ×1
CHLORAPREP W/TINT 26 (MISCELLANEOUS) ×2 IMPLANT
CLIP TI MEDIUM 6 (CLIP) ×4 IMPLANT
CLIP TI WIDE RED SMALL 6 (CLIP) ×4 IMPLANT
COVER SURGICAL LIGHT HANDLE (MISCELLANEOUS) ×2 IMPLANT
DERMABOND ADVANCED (GAUZE/BANDAGES/DRESSINGS) ×1
DERMABOND ADVANCED .7 DNX12 (GAUZE/BANDAGES/DRESSINGS) ×1 IMPLANT
DRAPE LAPAROTOMY T 98X78 PEDS (DRAPES) ×2 IMPLANT
DRAPE UTILITY XL STRL (DRAPES) ×2 IMPLANT
ELECT PENCIL ROCKER SW 15FT (MISCELLANEOUS) ×2 IMPLANT
ELECT REM PT RETURN 15FT ADLT (MISCELLANEOUS) ×2 IMPLANT
GAUZE 4X4 16PLY ~~LOC~~+RFID DBL (SPONGE) ×2 IMPLANT
GLOVE SURG SYN 7.5  E (GLOVE) ×2
GLOVE SURG SYN 7.5 E (GLOVE) ×2 IMPLANT
GLOVE SURG SYN 7.5 PF PI (GLOVE) ×2 IMPLANT
GOWN STRL REUS W/TWL XL LVL3 (GOWN DISPOSABLE) ×4 IMPLANT
HEMOSTAT SURGICEL 2X4 FIBR (HEMOSTASIS) ×2 IMPLANT
ILLUMINATOR WAVEGUIDE N/F (MISCELLANEOUS) ×2 IMPLANT
KIT BASIN OR (CUSTOM PROCEDURE TRAY) ×2 IMPLANT
KIT TURNOVER KIT A (KITS) IMPLANT
PACK BASIC VI WITH GOWN DISP (CUSTOM PROCEDURE TRAY) ×2 IMPLANT
SHEARS HARMONIC 9CM CVD (BLADE) ×2 IMPLANT
SUT MNCRL AB 4-0 PS2 18 (SUTURE) ×2 IMPLANT
SUT VIC AB 3-0 SH 18 (SUTURE) ×4 IMPLANT
SYR BULB IRRIG 60ML STRL (SYRINGE) ×2 IMPLANT
TOWEL OR 17X26 10 PK STRL BLUE (TOWEL DISPOSABLE) ×2 IMPLANT
TOWEL OR NON WOVEN STRL DISP B (DISPOSABLE) ×2 IMPLANT
TUBING CONNECTING 10 (TUBING) ×2 IMPLANT

## 2021-04-04 NOTE — Anesthesia Postprocedure Evaluation (Signed)
Anesthesia Post Note  Patient: Stacey Carney  Procedure(s) Performed: TOTAL THYROIDECTOMY     Patient location during evaluation: PACU Anesthesia Type: General Level of consciousness: awake Pain management: pain level controlled Vital Signs Assessment: post-procedure vital signs reviewed and stable Respiratory status: spontaneous breathing and respiratory function stable Cardiovascular status: stable Postop Assessment: no apparent nausea or vomiting Anesthetic complications: no   No notable events documented.  Last Vitals:  Vitals:   04/04/21 1115 04/04/21 1130  BP: 111/72 119/72  Pulse: 80 88  Resp:  13  Temp:  36.6 C  SpO2: 97% 100%    Last Pain:  Vitals:   04/04/21 1130  TempSrc:   PainSc: 0-No pain                 Merlinda Frederick

## 2021-04-04 NOTE — Interval H&P Note (Signed)
History and Physical Interval Note:  04/04/2021 9:04 AM  Stacey Carney  has presented today for surgery, with the diagnosis of thyroid neoplasm.  The various methods of treatment have been discussed with the patient and family. After consideration of risks, benefits and other options for treatment, the patient has consented to    Procedure(s): TOTAL THYROIDECTOMY (N/A) as a surgical intervention.    The patient's history has been reviewed, patient examined, no change in status, stable for surgery.  I have reviewed the patient's chart and labs.  Questions were answered to the patient's satisfaction.    Armandina Gemma, [facility] [place] practice Office: [place]

## 2021-04-04 NOTE — Transfer of Care (Addendum)
Immediate Anesthesia Transfer of Care Note  Patient: Stacey Carney  Procedure(s) Performed: TOTAL THYROIDECTOMY  Patient Location: PACU  Anesthesia Type:General  Level of Consciousness: sedated  Airway & Oxygen Therapy: Patient Spontanous Breathing and Patient connected to face mask oxygen  Post-op Assessment: Report given to RN and Post -op Vital signs reviewed and stable  Post vital signs: Reviewed and stable  Last Vitals:  Vitals Value Taken Time  BP 128/90 04/04/21 1053  Temp    Pulse 96 04/04/21 1055  Resp    SpO2 99 % 04/04/21 1055  Vitals shown include unvalidated device data.  Last Pain:  Vitals:   04/04/21 0746  TempSrc: Oral  PainSc:          Complications: No notable events documented.

## 2021-04-04 NOTE — Op Note (Signed)
Procedure Note  Pre-operative Diagnosis:  Thyroid neoplasm of uncertain behavior, Bethesda category V  Post-operative Diagnosis:  same  Surgeon:  Armandina Gemma, MD  Assistant:  Mohammed Kindle, RN   Procedure:  Total thyroidectomy  Anesthesia:  General  Estimated Blood Loss:  minimal  Drains: none         Specimen: thyroid to pathology  Indications:  Patient is referred by Dr. 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 Patient had noted in July 2020 to a mass in the left anterior neck on self-examination. She presented to her primary care physician and was sent for CT scan. This demonstrated multiple thyroid nodules. Patient underwent thyroid ultrasound on December 04, 2020. Bilateral thyroid nodules were noted. Several of these nodules were felt to be moderately suspicious. Fine-needle aspiration biopsies were performed on January 30, 2021. The dominant nodule on the left side measuring 3.3 cm was read as suspicious for malignancy, Bethesda category V.  Patient now comes to surgery for thyroidectomy.  Procedure Details: Procedure was done in OR #1 at the [facility]. The patient was brought to the operating room and placed in a supine position on the operating room table. Following administration of general anesthesia, the patient was positioned and then prepped and draped in the usual aseptic fashion. After ascertaining that an adequate level of anesthesia had been achieved, a small Kocher incision was made with #15 blade. Dissection was carried through subcutaneous tissues and platysma.Hemostasis was achieved with the electrocautery. Skin flaps were elevated cephalad and caudad from the thyroid notch to the sternal notch. A Mahorner self-retaining retractor was placed for exposure. Strap muscles were incised in the midline and dissection was begun on the left side.  Strap muscles were  reflected laterally.  Left thyroid lobe was normal in size with nodules.  The left lobe was gently mobilized with blunt dissection. Superior pole vessels were dissected out and divided individually between small and medium ligaclips with the harmonic scalpel. The thyroid lobe was rolled anteriorly. Branches of the inferior thyroid artery were divided between small ligaclips with the harmonic scalpel. Inferior venous tributaries were divided between ligaclips. Both the superior and inferior parathyroid glands were identified and preserved on their vascular pedicles. The recurrent laryngeal nerve was identified and preserved along its course. The ligament of Gwenlyn Found was released with the electrocautery and the gland was mobilized onto the anterior trachea. Isthmus was mobilized across the midline. There was no significant pyramidal lobe present. Dry pack was placed in the left neck.  The right thyroid lobe was gently mobilized with blunt dissection. Right thyroid lobe was normal in size with nodules. Superior pole vessels were dissected out and divided between small and medium ligaclips with the Harmonic scalpel. Superior parathyroid was identified and preserved. Inferior venous tributaries were divided between medium ligaclips with the harmonic scalpel. The right thyroid lobe was rolled anteriorly and the branches of the inferior thyroid artery divided between small ligaclips. The right recurrent laryngeal nerve was identified and preserved along its course. The ligament of Gwenlyn Found was released with the electrocautery. The right thyroid lobe was mobilized onto the anterior trachea and the remainder of the thyroid was dissected off the anterior trachea and the thyroid was completely excised. A suture was used to mark the left lobe. The entire thyroid gland was submitted to pathology for review.  The neck was irrigated with warm saline. Fibrillar was placed throughout the operative  field. Strap muscles were  approximated in the midline with interrupted 3-0 Vicryl sutures. Platysma was closed with interrupted 3-0 Vicryl sutures. Skin was closed with a running 4-0 Monocryl subcuticular suture. Wound was washed and Dermabond was applied. The patient was awakened from anesthesia and brought to the recovery room. The patient tolerated the procedure well.   Armandina Gemma, MD [facility] Office: [phone]

## 2021-04-04 NOTE — Progress Notes (Signed)
Transition of Care [facility]) Screening Note  Patient Details  Name: Stacey Carney [facility] Date of Birth: December 21, 1965  Transition of Care [facility]) CM/SW Contact:    Sherie Don, LCSW Phone Number: 04/04/2021, 3:26 PM  Transition of Care Department [facility]) has reviewed patient and no TOC needs have been identified at this time. We will continue to monitor patient advancement through interdisciplinary progression rounds. If new patient transition needs arise, please place a TOC consult.

## 2021-04-04 NOTE — Anesthesia Procedure Notes (Signed)
Procedure Name: Intubation Date/Time: 04/04/2021 9:30 AM Performed by: Lavina Hamman, CRNA Pre-anesthesia Checklist: Patient identified, Emergency Drugs available, Suction available, Patient being monitored and Timeout performed Patient Re-evaluated:Patient Re-evaluated prior to induction Oxygen Delivery Method: Circle system utilized Preoxygenation: Pre-oxygenation with 100% oxygen Induction Type: IV induction Ventilation: Mask ventilation without difficulty Laryngoscope Size: Mac and 3 Grade View: Grade I Tube type: Oral Tube size: 7.0 mm Number of attempts: 1 Airway Equipment and Method: Stylet Placement Confirmation: ETT inserted through vocal cords under direct vision, positive ETCO2, CO2 detector and breath sounds checked- equal and bilateral Secured at: 21 cm Tube secured with: Tape Dental Injury: Teeth and Oropharynx as per pre-operative assessment  Comments: ATOI

## 2021-04-04 NOTE — Anesthesia Preprocedure Evaluation (Addendum)
Anesthesia Evaluation  Patient identified by MRN, date of birth, ID band Patient awake    Reviewed: Allergy & Precautions, NPO status , Patient's Chart, lab work & pertinent test results  Airway Mallampati: III  TM Distance: >3 FB Neck ROM: Full    Dental no notable dental hx.    Pulmonary neg pulmonary ROS,    Pulmonary exam normal breath sounds clear to auscultation       Cardiovascular negative cardio ROS Normal cardiovascular exam Rhythm:Regular Rate:Normal     Neuro/Psych negative neurological ROS  negative psych ROS   GI/Hepatic negative GI ROS, Neg liver ROS,   Endo/Other  Obesity Thyroid nodule  Renal/GU negative Renal ROS  negative genitourinary   Musculoskeletal negative musculoskeletal ROS (+)   Abdominal   Peds negative pediatric ROS (+)  Hematology negative hematology ROS (+)   Anesthesia Other Findings   Reproductive/Obstetrics negative OB ROS                            Anesthesia Physical Anesthesia Plan  ASA: 2  Anesthesia Plan: General   Post-op Pain Management:    Induction: Intravenous  PONV Risk Score and Plan: Treatment may vary due to age or medical condition, Ondansetron, Dexamethasone and Midazolam  Airway Management Planned: Oral ETT  Additional Equipment:   Intra-op Plan:   Post-operative Plan: Extubation in OR  Informed Consent: I have reviewed the patients History and Physical, chart, labs and discussed the procedure including the risks, benefits and alternatives for the proposed anesthesia with the patient or authorized representative who has indicated his/her understanding and acceptance.     Dental advisory given  Plan Discussed with: CRNA, Anesthesiologist and Surgeon  Anesthesia Plan Comments:         Anesthesia Quick Evaluation

## 2021-04-05 ENCOUNTER — Encounter (HOSPITAL_COMMUNITY): Payer: Self-pay | Admitting: Surgery

## 2021-04-05 DIAGNOSIS — D440 Neoplasm of uncertain behavior of thyroid gland: Secondary | ICD-10-CM | POA: Diagnosis not present

## 2021-04-05 DIAGNOSIS — E669 Obesity, unspecified: Secondary | ICD-10-CM | POA: Diagnosis not present

## 2021-04-05 DIAGNOSIS — D34 Benign neoplasm of thyroid gland: Secondary | ICD-10-CM | POA: Diagnosis not present

## 2021-04-05 DIAGNOSIS — Z6836 Body mass index (BMI) 36.0-36.9, adult: Secondary | ICD-10-CM | POA: Diagnosis not present

## 2021-04-05 DIAGNOSIS — E042 Nontoxic multinodular goiter: Secondary | ICD-10-CM | POA: Diagnosis not present

## 2021-04-05 LAB — BASIC METABOLIC PANEL
Anion gap: 9 (ref 5–15)
BUN: 12 mg/dL (ref 6–20)
CO2: 28 mmol/L (ref 22–32)
Calcium: 9 mg/dL (ref 8.9–10.3)
Chloride: 102 mmol/L (ref 98–111)
Creatinine, Ser: 0.65 mg/dL (ref 0.44–1.00)
GFR, Estimated: >60 mL/min (ref >60)
Glucose, Bld: 107 mg/dL — ABNORMAL HIGH (ref 70–99)
Potassium: 4.3 mmol/L (ref 3.5–5.1)
Sodium: 139 mmol/L (ref 135–145)

## 2021-04-05 MED ORDER — CALCIUM CARBONATE ANTACID 500 MG PO CHEW: 2.0000 | CHEWABLE_TABLET | Freq: Three times a day (TID) | ORAL | 1 refills | Status: AC

## 2021-04-05 MED ORDER — LEVOTHYROXINE SODIUM 100 MCG PO TABS
100.0000 ug | ORAL_TABLET | Freq: Every day | ORAL | 3 refills | Status: AC
Start: 2021-04-05 — End: 2022-04-05

## 2021-04-05 NOTE — Plan of Care (Signed)
  Problem: Pain Managment: Goal: General experience of comfort will improve Outcome: Progressing   

## 2021-04-05 NOTE — Discharge Summary (Signed)
°  °  Physician Discharge Summary   Patient ID: Stacey Carney MRN: [id number] DOB/AGE: [birth year] 56 y.o.  Admit date: 04/04/2021  Discharge date: 04/05/2021  Discharge Diagnoses:  Principal Problem:   Neoplasm of uncertain behavior of thyroid gland Active Problems:   Multiple thyroid nodules   Discharged Condition: good  Hospital Course: Patient was admitted for observation following thyroid surgery.  Post op course was uncomplicated.  Pain was well controlled.  Tolerated diet.  Post op calcium level on morning following surgery was 9.0 mg/dl.  Patient was prepared for discharge home on POD#1.   Consults: None  Treatments: surgery: total thyroidectomy  Discharge Exam: Blood pressure 126/87, pulse 92, temperature 97.8 F (36.6 C), temperature source Oral, resp. rate 18, height 5\' 6"  (1.676 m), weight 102.1 kg, last menstrual period 08/01/2017, SpO2 98 %. HEENT - clear Neck - wound dry and intact; mild STS; voice normal  Disposition: Home  Discharge Instructions     Diet - low sodium heart healthy   Complete by: As directed    Increase activity slowly   Complete by: As directed    No dressing needed   Complete by: As directed       Allergies as of 04/05/2021       Reactions   Penicillins Hives        Medication List     TAKE these medications    calcium carbonate 500 MG chewable tablet Commonly known as: Tums Chew 2 tablets (400 mg of elemental calcium total) by mouth 3 (three) times daily.   fluticasone 50 MCG/ACT nasal spray Commonly known as: FLONASE Place 2 sprays into both nostrils daily as needed for allergies or rhinitis.   levothyroxine 100 MCG tablet Commonly known as: Synthroid Take 1 tablet (100 mcg total) by mouth daily before breakfast.   loratadine 10 MG tablet Commonly known as: CLARITIN Take 10 mg by mouth daily as needed for allergies.   methocarbamol 500 MG tablet Commonly known as: Robaxin Take 1 tablet (500 mg total) by mouth  every 8 (eight) hours as needed.   OMEGA 3 PO Take 1 capsule by mouth daily.   TURMERIC PO Take 1 capsule by mouth daily.               Discharge Care Instructions  (From admission, onward)           Start     Ordered   04/05/21 0000  No dressing needed        04/05/21 1191            Follow-up Information     Armandina Gemma, MD. Schedule an appointment as soon as possible for a visit in 3 week(s).   Specialty: General Surgery Why: For wound re-check Contact information: [place] [address] [phone]                 Maisee Vollman, [facility] Office: [phone]   Signed: Armandina Gemma 04/05/2021, 8:14 AM

## 2021-04-05 NOTE — Discharge Instructions (Signed)
[facility] - Dr. Liset Mcmonigle  THYROID & PARATHYROID SURGERY:  POST-OP INSTRUCTIONS  Always review the instruction sheet provided by the hospital nurse at discharge.  A prescription for pain medication may be sent to your pharmacy at the time of discharge.  Take your pain medication as prescribed.  If narcotic pain medicine is not needed, then you may take acetaminophen (Tylenol) or ibuprofen (Advil) as needed for pain or soreness.  Take your normal home medications as prescribed unless otherwise directed.  If you need a refill on your pain medication, please contact the office during regular business hours.  Prescriptions will not be processed by the office after 5:00PM or on weekends.  Start with a light diet upon arrival home, such as soup and crackers or toast.  Be sure to drink plenty of fluids.  Resume your normal diet the day after surgery.  Most patients will experience some swelling and bruising on the chest and neck area.  Ice packs will help for the first 48 hours after arriving home.  Swelling and bruising will take several days to resolve.   It is common to experience some constipation after surgery.  Increasing fluid intake and taking a stool softener (Colace) will usually help to prevent this problem.  A mild laxative (Milk of Magnesia or Miralax) should be taken according to package directions if there has been no bowel movement after 48 hours.  Dermabond glue covers your incision. This seals the wound and you may shower at any time. The Dermabond will remain in place for about a week.  You may gradually remove the glue when it loosens around the edges.  If you need to loosen the Dermabond for removal, apply a layer of Vaseline to the wound for 15 minutes and then remove with a Kleenex. Your sutures are under the skin and will not show - they will dissolve on their own.  You may resume light daily activities beginning the day after discharge (such as self-care,  walking, climbing stairs), gradually increasing activities as tolerated. You may have sexual intercourse when it is comfortable. Refrain from any heavy lifting or straining until approved by your doctor. You may drive when you no longer are taking prescription pain medication, you can comfortably wear a seatbelt, and you can safely maneuver your car and apply the brakes.  You will see your doctor in the office for a follow-up appointment approximately three weeks after your surgery.  Make sure that you call for this appointment within a day or two after you arrive home to insure a convenient appointment time. Please have any requested laboratory tests performed a few days prior to your office visit so that the results will be available at your follow up appointment.  WHEN TO CALL THE [facility] OFFICE: -- Fever greater than 101.5 -- Inability to urinate -- Nausea and/or vomiting - persistent -- Extreme swelling or bruising -- Continued bleeding from incision -- Increased pain, redness, or drainage from the incision -- Difficulty swallowing or breathing -- Muscle cramping or spasms -- Numbness or tingling in hands or around lips  The clinic staff is available to answer your questions during regular business hours.  Please don't hesitate to call and ask to speak to one of the nurses if you have concerns.  [facility] OFFICE: [phone] (24 hours)  Please sign up for MyChart accounts. This will allow you to communicate directly with my nurse or myself without having to call the office. It will also allow you   to view your test results. You will need to enroll in MyChart for my office ([facility]) and for the hospital ([place]).  Stacey Storlie, MD [facility] A [facility] practice 

## 2021-04-07 LAB — SURGICAL PATHOLOGY

## 2021-04-08 NOTE — Progress Notes (Signed)
Telephone call to patient with pathology results - BENIGN.  Hurthle cell adenoma, 1.1 cm, and adenomatous nodules.  No sign of malignancy.  Doing well.  Voice normal.  Will see in office for post op visit.  tmg  Armandina Gemma, [facility] Office: [phone]

## 2021-04-23 DIAGNOSIS — D440 Neoplasm of uncertain behavior of thyroid gland: Secondary | ICD-10-CM | POA: Diagnosis not present

## 2021-04-23 DIAGNOSIS — E890 Postprocedural hypothyroidism: Secondary | ICD-10-CM | POA: Diagnosis not present

## 2021-05-21 DIAGNOSIS — D34 Benign neoplasm of thyroid gland: Secondary | ICD-10-CM | POA: Diagnosis not present

## 2021-05-21 DIAGNOSIS — E890 Postprocedural hypothyroidism: Secondary | ICD-10-CM | POA: Diagnosis not present

## 2021-06-18 DIAGNOSIS — L821 Other seborrheic keratosis: Secondary | ICD-10-CM | POA: Diagnosis not present

## 2021-06-18 DIAGNOSIS — L814 Other melanin hyperpigmentation: Secondary | ICD-10-CM | POA: Diagnosis not present

## 2021-06-18 DIAGNOSIS — D225 Melanocytic nevi of trunk: Secondary | ICD-10-CM | POA: Diagnosis not present

## 2021-06-18 DIAGNOSIS — L578 Other skin changes due to chronic exposure to nonionizing radiation: Secondary | ICD-10-CM | POA: Diagnosis not present

## 2021-08-20 DIAGNOSIS — E890 Postprocedural hypothyroidism: Secondary | ICD-10-CM | POA: Diagnosis not present

## 2021-08-20 DIAGNOSIS — R5383 Other fatigue: Secondary | ICD-10-CM | POA: Diagnosis not present

## 2021-08-20 DIAGNOSIS — E6609 Other obesity due to excess calories: Secondary | ICD-10-CM | POA: Diagnosis not present

## 2021-10-15 ENCOUNTER — Other Ambulatory Visit: Payer: Self-pay | Admitting: Family Medicine

## 2021-10-15 DIAGNOSIS — Z1231 Encounter for screening mammogram for malignant neoplasm of breast: Secondary | ICD-10-CM

## 2021-11-04 ENCOUNTER — Ambulatory Visit: Payer: BC Managed Care – PPO | Admitting: Family Medicine

## 2021-11-05 ENCOUNTER — Ambulatory Visit: Payer: BC Managed Care – PPO | Admitting: Family Medicine

## 2021-11-05 DIAGNOSIS — E890 Postprocedural hypothyroidism: Secondary | ICD-10-CM | POA: Diagnosis not present

## 2021-11-05 DIAGNOSIS — E559 Vitamin D deficiency, unspecified: Secondary | ICD-10-CM | POA: Diagnosis not present

## 2021-11-05 DIAGNOSIS — E6609 Other obesity due to excess calories: Secondary | ICD-10-CM | POA: Diagnosis not present

## 2021-11-05 DIAGNOSIS — Z0000 Encounter for general adult medical examination without abnormal findings: Secondary | ICD-10-CM | POA: Diagnosis not present

## 2021-11-12 DIAGNOSIS — M6530 Trigger finger, unspecified finger: Secondary | ICD-10-CM | POA: Diagnosis not present

## 2021-11-26 DIAGNOSIS — M6530 Trigger finger, unspecified finger: Secondary | ICD-10-CM | POA: Diagnosis not present

## 2021-12-03 ENCOUNTER — Ambulatory Visit: Payer: BC Managed Care – PPO

## 2021-12-10 DIAGNOSIS — M6530 Trigger finger, unspecified finger: Secondary | ICD-10-CM | POA: Diagnosis not present

## 2021-12-17 ENCOUNTER — Ambulatory Visit: Payer: Self-pay

## 2021-12-17 ENCOUNTER — Encounter: Payer: Self-pay | Admitting: Family Medicine

## 2021-12-17 ENCOUNTER — Ambulatory Visit: Payer: BC Managed Care – PPO | Admitting: Family Medicine

## 2021-12-17 VITALS — BP 140/86 | Ht 66.0 in | Wt 225.0 lb

## 2021-12-17 DIAGNOSIS — M25562 Pain in left knee: Secondary | ICD-10-CM | POA: Diagnosis not present

## 2021-12-17 DIAGNOSIS — M23204 Derangement of unspecified medial meniscus due to old tear or injury, left knee: Secondary | ICD-10-CM | POA: Diagnosis not present

## 2021-12-17 MED ORDER — MELOXICAM 15 MG PO TABS: 15.0000 mg | ORAL_TABLET | Freq: Every day | ORAL | 2 refills | Status: AC

## 2021-12-17 NOTE — Progress Notes (Signed)
  Stacey Carney - 56 y.o. female MRN [id number]  Date of birth: 15-Jan-1966  SUBJECTIVE:  Including CC & ROS.  No chief complaint on file.   Stacey Carney is a 56 y.o. female that is presenting with acute on chronic left knee pain.  The pain is over the medial aspect.  No history of similar pain.   Review of Systems See HPI   HISTORY: Past Medical, Surgical, Social, and Family History Reviewed & Updated per EMR.   Pertinent Historical Findings include:  Past Medical History:  Diagnosis Date   Thyroid nodule     Past Surgical History:  Procedure Laterality Date   COLONOSCOPY     2022   THYROIDECTOMY N/A 04/04/2021   Procedure: TOTAL THYROIDECTOMY;  Surgeon: Armandina Gemma, MD;  Location: [facility];  Service: General;  Laterality: N/A;   TONSILLECTOMY       PHYSICAL EXAM:  VS: BP (!) 140/86 (BP Location: Left Arm, Patient Position: Sitting)   Ht '5\' 6"'$  (1.676 m)   Wt 225 lb (102.1 kg)   LMP 08/01/2017   BMI 36.32 kg/m  Physical Exam Gen: NAD, alert, cooperative with exam, well-appearing MSK:  Neurovascularly intact    Limited ultrasound: Left knee:  Mild effusion suprapatellar pouch. Normal-appearing quadricep and patellar tendon. Normal-appearing lateral joint space. Mild degenerative changes of the medial meniscus with increased hyperemia  Summary: Findings consistent with irritation of the medial meniscus.  Ultrasound and interpretation by Clearance Coots, MD    ASSESSMENT & PLAN:   Degenerative tear of medial meniscus of left knee Acutely occurring.  Findings most consistent with irritation of the medial meniscus. -Counseled on home exercise therapy and supportive care. -Meloxicam. -Counseled on compression. -Could consider injection or physical therapy. - green sport insoles

## 2021-12-17 NOTE — Patient Instructions (Signed)
Nice to meet you  Please use ice as needed Please consider the compression  You can take tylenol with the mobic  Please try the exercises  Please send me a message in MyChart with any questions or updates.  Please see me back in 4 weeks.   --Dr. Raeford Razor

## 2021-12-17 NOTE — Assessment & Plan Note (Signed)
Acutely occurring.  Findings most consistent with irritation of the medial meniscus. -Counseled on home exercise therapy and supportive care. -Meloxicam. -Counseled on compression. -Could consider injection or physical therapy. - green sport insoles

## 2021-12-24 ENCOUNTER — Ambulatory Visit
Admission: RE | Admit: 2021-12-24 | Discharge: 2021-12-24 | Disposition: A | Discharge status: Home / Self Care | Payer: BC Managed Care – PPO | Source: Ambulatory Visit | Attending: Family Medicine | Admitting: Family Medicine

## 2021-12-24 DIAGNOSIS — Z1231 Encounter for screening mammogram for malignant neoplasm of breast: Secondary | ICD-10-CM | POA: Diagnosis not present

## 2021-12-29 ENCOUNTER — Encounter: Payer: Self-pay | Admitting: Family Medicine

## 2022-01-07 DIAGNOSIS — Z6837 Body mass index (BMI) 37.0-37.9, adult: Secondary | ICD-10-CM | POA: Diagnosis not present

## 2022-01-07 DIAGNOSIS — Z01419 Encounter for gynecological examination (general) (routine) without abnormal findings: Secondary | ICD-10-CM | POA: Diagnosis not present

## 2022-01-07 DIAGNOSIS — M6530 Trigger finger, unspecified finger: Secondary | ICD-10-CM | POA: Diagnosis not present

## 2022-01-14 ENCOUNTER — Encounter: Payer: Self-pay | Admitting: Family Medicine

## 2022-01-14 ENCOUNTER — Ambulatory Visit: Payer: Self-pay

## 2022-01-14 ENCOUNTER — Telehealth: Payer: BC Managed Care – PPO | Admitting: Family Medicine

## 2022-01-14 VITALS — BP 126/80 | Ht 66.0 in | Wt 225.0 lb

## 2022-01-14 DIAGNOSIS — M23204 Derangement of unspecified medial meniscus due to old tear or injury, left knee: Secondary | ICD-10-CM

## 2022-01-14 MED ORDER — TRIAMCINOLONE ACETONIDE 40 MG/ML IJ SUSP
40.0000 mg | Freq: Once | INTRAMUSCULAR | Status: AC
  Administered 2022-01-14: 40 mg via INTRA_ARTICULAR

## 2022-01-14 NOTE — Patient Instructions (Signed)
Good to see you Please use ice  Please continue the exercises   Please send me a message in MyChart with any questions or updates.  Please see me back in 6-8 weeks.   --Dr. Raeford Razor

## 2022-01-14 NOTE — Assessment & Plan Note (Signed)
Acutely occurring.  Still having pain around the joint line. -Counseled on home exercise therapy and supportive care. -Injection today. -Consider physical therapy or further imaging.

## 2022-01-14 NOTE — Progress Notes (Signed)
  Stacey Carney - 55 y.o. female MRN [id number]  Date of birth: [birth year]  SUBJECTIVE:  Including CC & ROS.  No chief complaint on file.   Stacey Carney is a 56 y.o. female that is following up for her knee pain.  She has noticed some improvement but the pain is still lingering.  She cannot walk down the stairs.  Has been using compression and ice.    Review of Systems See HPI   HISTORY: Past Medical, Surgical, Social, and Family History Reviewed & Updated per EMR.   Pertinent Historical Findings include:  Past Medical History:  Diagnosis Date   Thyroid nodule     Past Surgical History:  Procedure Laterality Date   COLONOSCOPY     2022   THYROIDECTOMY N/A 04/04/2021   Procedure: TOTAL THYROIDECTOMY;  Surgeon: Armandina Gemma, MD;  Location: [facility];  Service: General;  Laterality: N/A;   TONSILLECTOMY       PHYSICAL EXAM:  VS: BP 126/80 (BP Location: Left Arm, Patient Position: Sitting)   Ht '5\' 6"'$  (1.676 m)   Wt 225 lb (102.1 kg)   LMP 08/01/2017   BMI 36.32 kg/m  Physical Exam Gen: NAD, alert, cooperative with exam, well-appearing MSK:  Neurovascularly intact     Aspiration/Injection Procedure Note Stacey Carney [facility] [birth year]  Procedure: Injection Indications: Left knee pain  Procedure Details Consent: Risks of procedure as well as the alternatives and risks of each were explained to the (patient/caregiver).  Consent for procedure obtained. Time Out: Verified patient identification, verified procedure, site/side was marked, verified correct patient position, special equipment/implants available, medications/allergies/relevent history reviewed, required imaging and test results available.  Performed.  The area was cleaned with iodine and alcohol swabs.    The left knee superior lateral suprapatellar pouch was injected using 2 cc of 1% lidocaine on a 21-gauge 2 inch needle.  The syringe was switched and a mixture containing 1 cc's of 40 mg Kenalog were  and 4 cc's of 0.25% bupivacaine was injected.  Ultrasound was used. Images were obtained in long views showing the injection.     A sterile dressing was applied.  Patient did tolerate procedure well.     ASSESSMENT & PLAN:   Degenerative tear of medial meniscus of left knee Acutely occurring.  Still having pain around the joint line. -Counseled on home exercise therapy and supportive care. -Injection today. -Consider physical therapy or further imaging.

## 2022-02-04 DIAGNOSIS — E559 Vitamin D deficiency, unspecified: Secondary | ICD-10-CM | POA: Diagnosis not present

## 2022-02-04 DIAGNOSIS — E890 Postprocedural hypothyroidism: Secondary | ICD-10-CM | POA: Diagnosis not present

## 2022-02-25 DIAGNOSIS — M6530 Trigger finger, unspecified finger: Secondary | ICD-10-CM | POA: Diagnosis not present

## 2022-03-04 ENCOUNTER — Encounter: Payer: Self-pay | Admitting: Family Medicine

## 2022-03-04 ENCOUNTER — Ambulatory Visit: Payer: BC Managed Care – PPO | Admitting: Family Medicine

## 2022-03-04 VITALS — BP 132/84 | Ht 66.0 in | Wt 223.0 lb

## 2022-03-04 DIAGNOSIS — M23204 Derangement of unspecified medial meniscus due to old tear or injury, left knee: Secondary | ICD-10-CM

## 2022-03-04 NOTE — Progress Notes (Signed)
  Stacey Carney - 56 y.o. female MRN [id number]  Date of birth: June 30, 1965  SUBJECTIVE:  Including CC & ROS.  No chief complaint on file.   Stacey Carney is a 56 y.o. female that is following up for her knee pain.  The pain has improved with the injection.  She has trouble with her range of motion.   Review of Systems See HPI   HISTORY: Past Medical, Surgical, Social, and Family History Reviewed & Updated per EMR.   Pertinent Historical Findings include:  Past Medical History:  Diagnosis Date   Thyroid nodule     Past Surgical History:  Procedure Laterality Date   COLONOSCOPY     2022   THYROIDECTOMY N/A 04/04/2021   Procedure: TOTAL THYROIDECTOMY;  Surgeon: Armandina Gemma, MD;  Location: [facility];  Service: General;  Laterality: N/A;   TONSILLECTOMY       PHYSICAL EXAM:  VS: BP 132/84   Ht '5\' 6"'$  (1.676 m)   Wt 223 lb (101.2 kg)   LMP 08/01/2017   BMI 35.99 kg/m  Physical Exam Gen: NAD, alert, cooperative with exam, well-appearing MSK:  Neurovascularly intact       ASSESSMENT & PLAN:   Degenerative tear of medial meniscus of left knee Doing well with the injection.  Has some limitations in her range of motion in flexion. -Counseled on home exercise therapy and supportive care. -Green sport insoles.  Can consider custom orthotics. -Would pursue physical therapy.

## 2022-03-04 NOTE — Assessment & Plan Note (Signed)
Doing well with the injection.  Has some limitations in her range of motion in flexion. -Counseled on home exercise therapy and supportive care. -Green sport insoles.  Can consider custom orthotics. -Would pursue physical therapy.

## 2022-03-11 DIAGNOSIS — M6530 Trigger finger, unspecified finger: Secondary | ICD-10-CM | POA: Diagnosis not present

## 2022-04-22 DIAGNOSIS — M6530 Trigger finger, unspecified finger: Secondary | ICD-10-CM | POA: Diagnosis not present

## 2022-04-30 ENCOUNTER — Ambulatory Visit: Payer: BC Managed Care – PPO | Admitting: Family Medicine

## 2022-04-30 ENCOUNTER — Encounter: Payer: Self-pay | Admitting: Family Medicine

## 2022-04-30 VITALS — BP 130/92 | Ht 66.0 in | Wt 225.0 lb

## 2022-04-30 DIAGNOSIS — S39012A Strain of muscle, fascia and tendon of lower back, initial encounter: Secondary | ICD-10-CM | POA: Diagnosis not present

## 2022-04-30 MED ORDER — METHOCARBAMOL 500 MG PO TABS: 500.0000 mg | ORAL_TABLET | Freq: Two times a day (BID) | ORAL | 1 refills | Status: AC | PRN

## 2022-04-30 NOTE — Assessment & Plan Note (Signed)
Acutely occurring.  She has a significant spasm in laxed flexion and extension.  No radicular pain today. -Counseled on home exercise therapy and supportive care. -Counseled on ibuprofen. -Robaxin. -Could consider physical therapy or further imaging.

## 2022-04-30 NOTE — Progress Notes (Signed)
  Stacey Carney - 57 y.o. female MRN [id number]  Date of birth: [birth year]  SUBJECTIVE:  Including CC & ROS.  No chief complaint on file.   Stacey Carney is a 57 y.o. female that is presenting with acute low back pain.  The pain is occurring bilaterally and intermittent in nature.  This causes her to lose sleep at night.  There is no radicular symptoms.  She does have a history of low back pain.  No history of surgery.    Review of Systems See HPI   HISTORY: Past Medical, Surgical, Social, and Family History Reviewed & Updated per EMR.   Pertinent Historical Findings include:  Past Medical History:  Diagnosis Date   Thyroid nodule     Past Surgical History:  Procedure Laterality Date   COLONOSCOPY     2022   THYROIDECTOMY N/A 04/04/2021   Procedure: TOTAL THYROIDECTOMY;  Surgeon: Armandina Gemma, MD;  Location: [facility];  Service: General;  Laterality: N/A;   TONSILLECTOMY       PHYSICAL EXAM:  VS: BP (!) 130/92   Ht '5\' 6"'$  (1.676 m)   Wt 225 lb (102.1 kg)   LMP 08/01/2017   BMI 36.32 kg/m  Physical Exam Gen: NAD, alert, cooperative with exam, well-appearing MSK:  Neurovascularly intact       ASSESSMENT & PLAN:   Lumbar strain, initial encounter Acutely occurring.  She has a significant spasm in laxed flexion and extension.  No radicular pain today. -Counseled on home exercise therapy and supportive care. -Counseled on ibuprofen. -Robaxin. -Could consider physical therapy or further imaging.

## 2022-04-30 NOTE — Patient Instructions (Signed)
Good to see you Please try heat  Please try the exercises  Please try the ibuprofen 600 mg three times daily  Please use the robaxin as needed   Please send me a message in MyChart with any questions or updates.  Please see me back in 2-3 weeks.   --Dr. Raeford Razor

## 2022-05-27 DIAGNOSIS — M6530 Trigger finger, unspecified finger: Secondary | ICD-10-CM | POA: Diagnosis not present

## 2022-06-25 DIAGNOSIS — M6530 Trigger finger, unspecified finger: Secondary | ICD-10-CM | POA: Diagnosis not present

## 2022-07-13 ENCOUNTER — Encounter: Payer: Self-pay | Admitting: *Deleted

## 2022-07-15 DIAGNOSIS — L814 Other melanin hyperpigmentation: Secondary | ICD-10-CM | POA: Diagnosis not present

## 2022-07-15 DIAGNOSIS — L821 Other seborrheic keratosis: Secondary | ICD-10-CM | POA: Diagnosis not present

## 2022-07-15 DIAGNOSIS — D225 Melanocytic nevi of trunk: Secondary | ICD-10-CM | POA: Diagnosis not present

## 2022-07-15 DIAGNOSIS — L578 Other skin changes due to chronic exposure to nonionizing radiation: Secondary | ICD-10-CM | POA: Diagnosis not present

## 2022-10-28 DIAGNOSIS — L821 Other seborrheic keratosis: Secondary | ICD-10-CM | POA: Diagnosis not present

## 2022-10-29 ENCOUNTER — Ambulatory Visit: Payer: BC Managed Care – PPO | Admitting: Family Medicine

## 2022-10-29 ENCOUNTER — Ambulatory Visit (INDEPENDENT_AMBULATORY_CARE_PROVIDER_SITE_OTHER): Payer: BC Managed Care – PPO

## 2022-10-29 ENCOUNTER — Other Ambulatory Visit: Payer: Self-pay

## 2022-10-29 VITALS — BP 142/92 | HR 109 | Ht 66.0 in | Wt 233.0 lb

## 2022-10-29 DIAGNOSIS — M25561 Pain in right knee: Secondary | ICD-10-CM | POA: Diagnosis not present

## 2022-10-29 DIAGNOSIS — M25461 Effusion, right knee: Secondary | ICD-10-CM | POA: Diagnosis not present

## 2022-10-29 DIAGNOSIS — M1711 Unilateral primary osteoarthritis, right knee: Secondary | ICD-10-CM | POA: Diagnosis not present

## 2022-10-29 NOTE — Patient Instructions (Addendum)
Thank you for coming in today.   Please get an Xray today before you leave   Call or go to the ER if you develop a large red swollen joint with extreme pain or oozing puss.    Recheck as needed.  

## 2022-10-29 NOTE — Progress Notes (Signed)
Rubin Payor, PhD, LAT, ATC acting as a scribe for Clementeen Graham, MD.  Stacey Carney is a 57 y.o. female who presents to [facility] today for R knee pain. Pt was previously seen by Dr. Jordan Likes on 04/30/22 for LBP.  Today, pt c/o R knee pain ongoing for over a month. She went for a 6 mile hike and her husband lost his wallet, so they had to re-hike much of the trail. Pt locates pain to the anterior aspect of her R knee. 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   R Knee swelling: no Mechanical symptoms: no Aggravates: worsens throughout the day Treatments tried: meloxicam, Voltaren gel  Pertinent review of systems: No fevers or chills  Relevant historical information: Left knee degenerative meniscus tear.   Exam:  BP (!) 142/92   Pulse (!) 109   Ht 5\' 6"  (1.676 m)   Wt 233 lb (105.7 kg)   LMP 08/01/2017   SpO2 97%   BMI 37.61 kg/m  General: Well Developed, well nourished, and in no acute distress.   MSK: Right knee mild effusion otherwise normal. Normal knee motion. Tender palpation medial joint line. Positive medial McMurray's test. Stable ligamentous exam. Intact strength.    Lab and Radiology Results  Procedure: Real-time Ultrasound Guided Injection of right knee joint superior lateral patella space Device: Philips Affiniti 50G/GE Logiq Images permanently stored and available for review in PACS Ultrasound evaluation prior to injection reveals mild joint effusion small Baker's cyst and degeneration medial joint line. Verbal informed consent obtained.  Discussed risks and benefits of procedure. Warned about infection, bleeding, hyperglycemia damage to structures among others. Patient expresses understanding and agreement Time-out conducted.   Noted no overlying erythema, induration, or other signs of local infection.   Skin prepped in a sterile fashion.   Local anesthesia: Topical Ethyl chloride.   With sterile  technique and under real time ultrasound guidance: 40 mg of Kenalog and 2 mL of Marcaine injected into knee joint. Fluid seen entering the joint capsule.   Completed without difficulty   Pain immediately resolved suggesting accurate placement of the medication.   Advised to call if fevers/chills, erythema, induration, drainage, or persistent bleeding.   Images permanently stored and available for review in the ultrasound unit.  Impression: Technically successful ultrasound guided injection.   X-ray images right knee obtained today personally and independently interpreted Minimal degenerative changes.  No acute fractures. Await formal radiology review     Assessment and Plan: 57 y.o. female with right knee pain thought to be due to degenerative medial meniscus tear.  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  MRI in the future could be potentially helpful if this knee pain does not resolve.  Plan for steroid injection today.  Check back as needed.  Continue quad strengthening exercises already taught.   PDMP not reviewed this encounter. Orders Placed This Encounter  Procedures   [place] LIMITED JOINT SPACE STRUCTURES LOW RIGHT(NO LINKED CHARGES)    Order Specific Question:   Reason for Exam (SYMPTOM  OR DIAGNOSIS REQUIRED)    Answer:   eval knee pain    Order Specific Question:   Preferred imaging location?    Answer:   [facility] Knee AP/LAT W/Sunrise Right    Standing Status:   Future    Number of Occurrences:   1    Standing Expiration Date:   10/29/2023  Order Specific Question:   Reason for Exam (SYMPTOM  OR DIAGNOSIS REQUIRED)    Answer:   eval knee pain    Order Specific Question:   Is patient pregnant?    Answer:   No    Order Specific Question:   Preferred imaging location?    Answer:   [facility]   No orders of the defined types were placed in this encounter.    Discussed warning signs or symptoms. Please see discharge  instructions. Patient expresses understanding.   The above documentation has been reviewed and is accurate and complete Clementeen Graham, M.D.

## 2022-11-04 ENCOUNTER — Ambulatory Visit: Payer: BC Managed Care – PPO | Admitting: Family Medicine

## 2022-11-04 NOTE — Progress Notes (Signed)
Right knee x-ray shows some mild arthritis

## 2022-11-11 DIAGNOSIS — E890 Postprocedural hypothyroidism: Secondary | ICD-10-CM | POA: Diagnosis not present

## 2022-11-11 DIAGNOSIS — Z131 Encounter for screening for diabetes mellitus: Secondary | ICD-10-CM | POA: Diagnosis not present

## 2022-11-11 DIAGNOSIS — Z13220 Encounter for screening for lipoid disorders: Secondary | ICD-10-CM | POA: Diagnosis not present

## 2022-11-11 DIAGNOSIS — Z0000 Encounter for general adult medical examination without abnormal findings: Secondary | ICD-10-CM | POA: Diagnosis not present

## 2022-11-11 DIAGNOSIS — E559 Vitamin D deficiency, unspecified: Secondary | ICD-10-CM | POA: Diagnosis not present

## 2022-11-11 DIAGNOSIS — Z6837 Body mass index (BMI) 37.0-37.9, adult: Secondary | ICD-10-CM | POA: Diagnosis not present

## 2022-11-11 DIAGNOSIS — J3089 Other allergic rhinitis: Secondary | ICD-10-CM | POA: Diagnosis not present

## 2023-01-20 DIAGNOSIS — Z1231 Encounter for screening mammogram for malignant neoplasm of breast: Secondary | ICD-10-CM | POA: Diagnosis not present

## 2023-01-20 DIAGNOSIS — N952 Postmenopausal atrophic vaginitis: Secondary | ICD-10-CM | POA: Diagnosis not present

## 2023-01-20 DIAGNOSIS — Z01411 Encounter for gynecological examination (general) (routine) with abnormal findings: Secondary | ICD-10-CM | POA: Diagnosis not present

## 2023-01-27 IMAGING — US US FNA BIOPSY THYROID 1ST LESION
1 series · 13 of 22 positions shown · non-contrast
Comparison: US Thyroid 12/04/20

MEDICATIONS:
10 cc 1% lidocaine

COMPLICATIONS:
None immediate.

INDICATION: Isthmus nodule: 2.2 cm

Left inferior thyroid nodule: 3.3 cm
EXAM:
ULTRASOUND GUIDED FINE NEEDLE ASPIRATION OF INDETERMINATE THYROID
NODULE
TECHNIQUE: Informed written consent was obtained from the patient after a
discussion of the risks, benefits and alternatives to treatment.
Questions regarding the procedure were encouraged and answered. A
timeout was performed prior to the initiation of the procedure.

[Series 1: us fna biopsy thyroid 1st lesion · 0.06mm/px · 22 acquisitions, 13 frames shown]
[im 1/22]
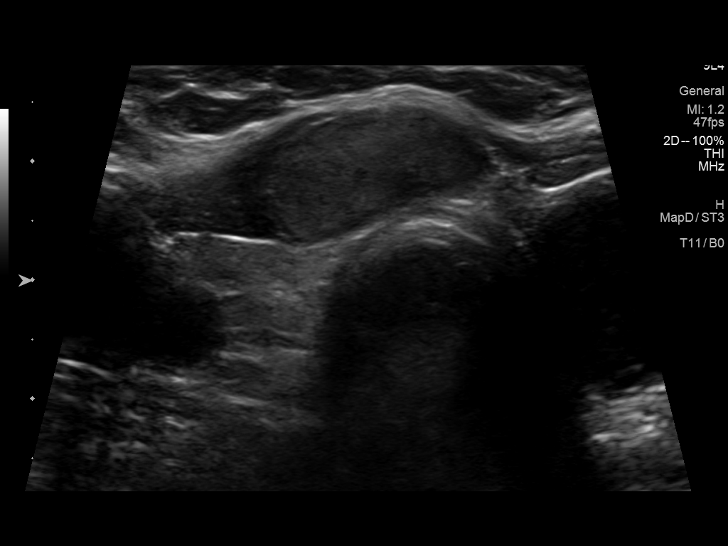
[im 3/22]
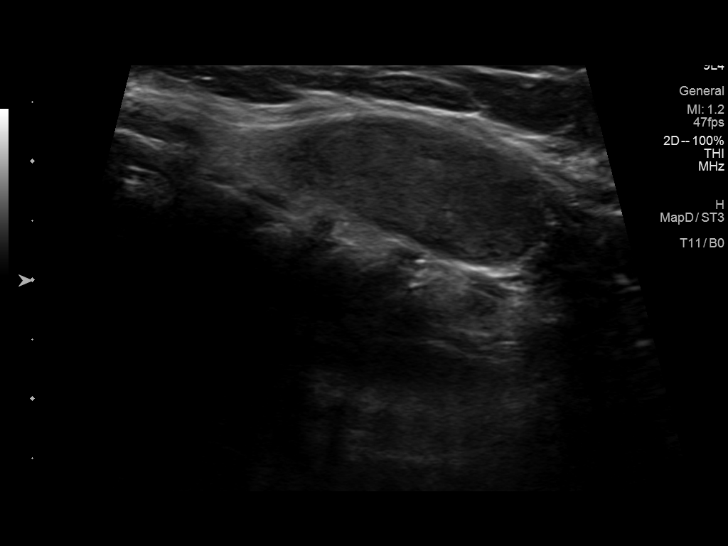
[im 5/22]
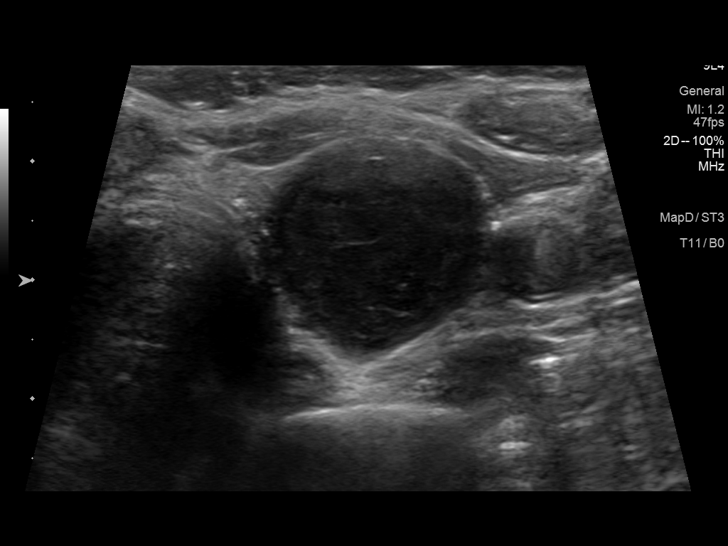
[im 6/22]
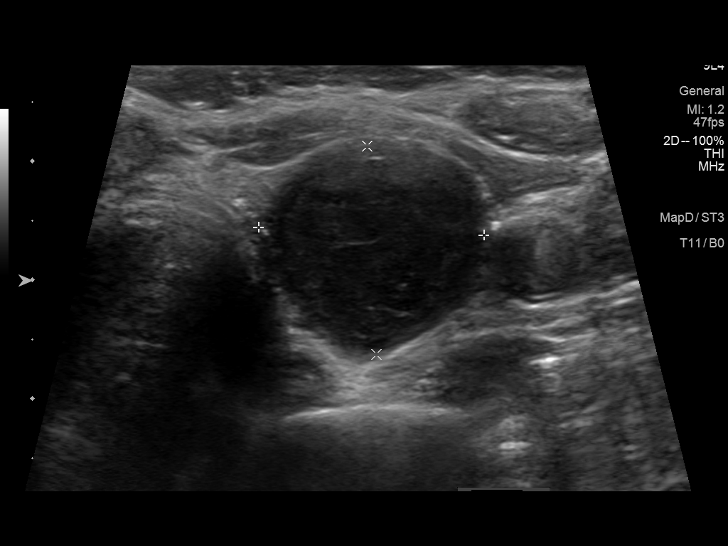
[im 8/22]
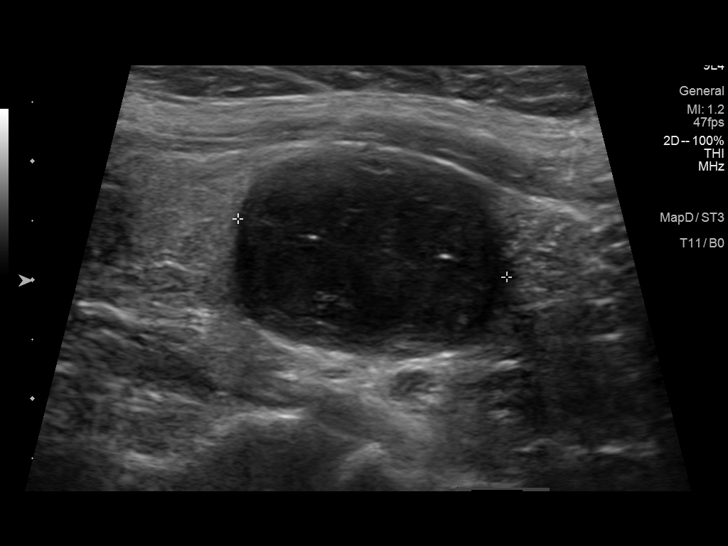
[im 10/22]
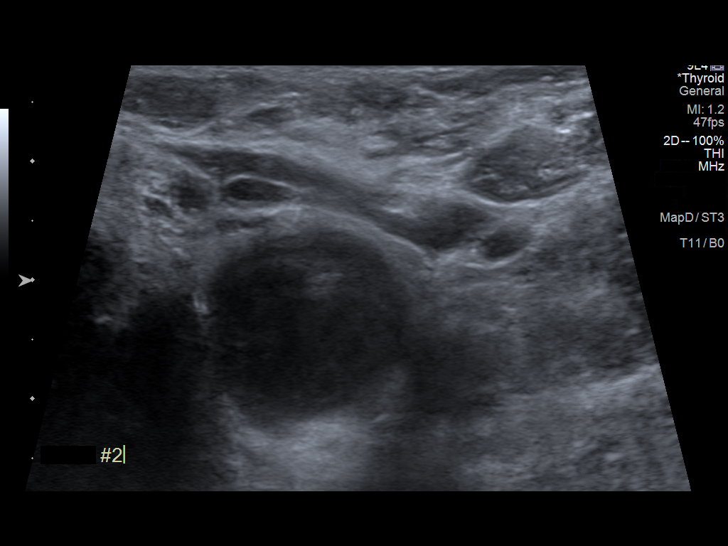
[im 12/22]
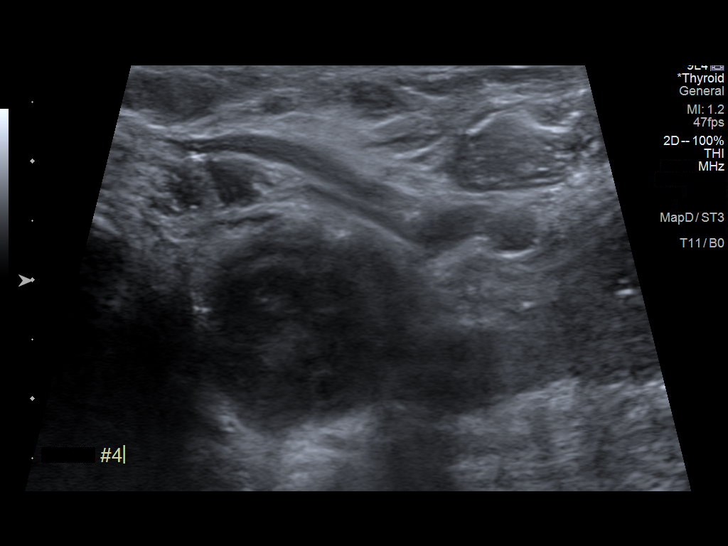
[im 13/22]
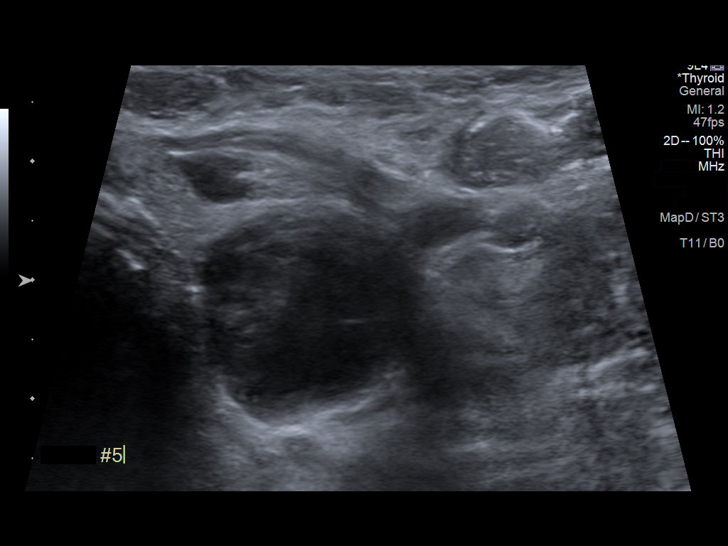
[im 15/22]
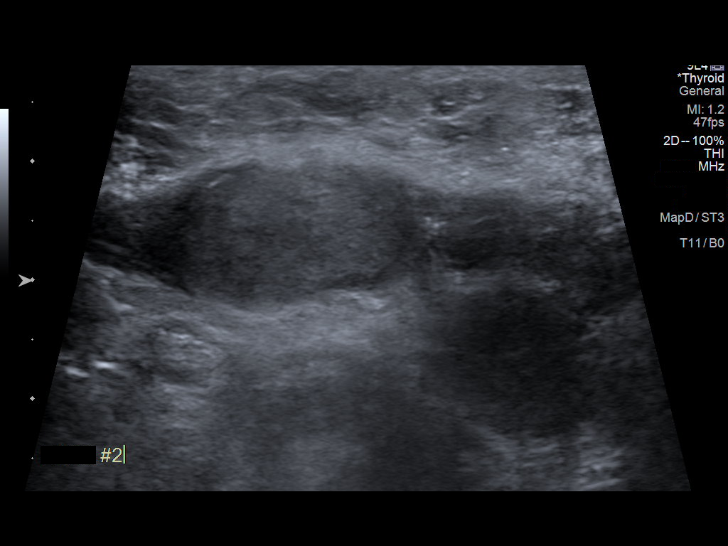
[im 17/22]
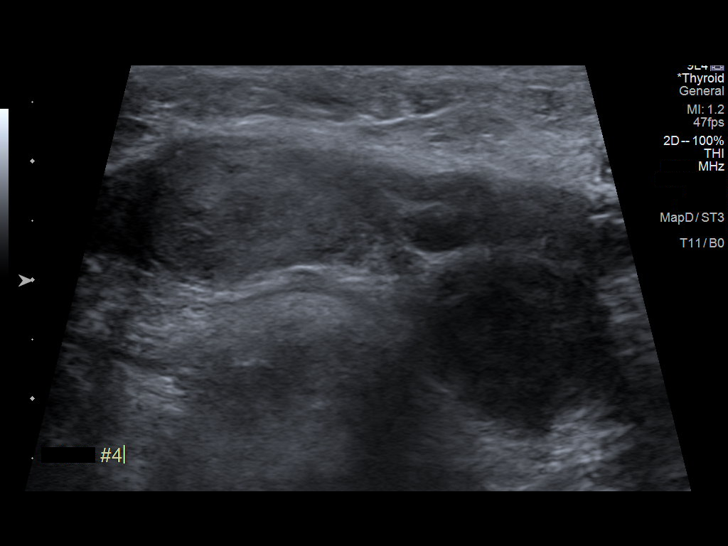
[im 18/22]
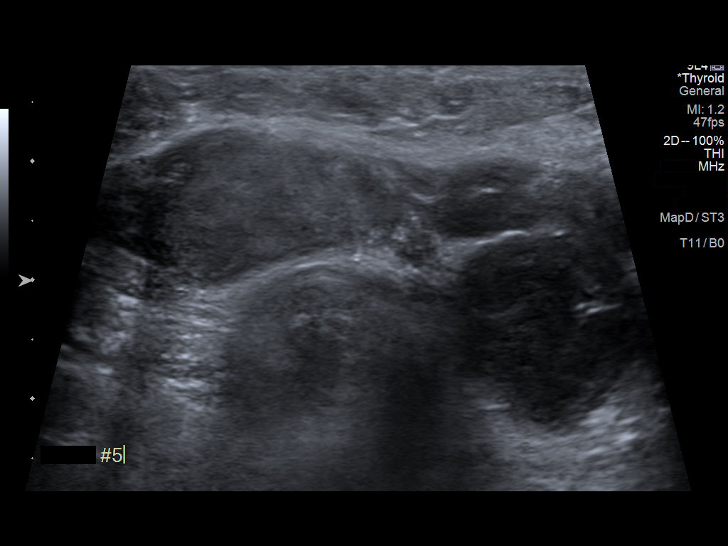
[im 20/22]
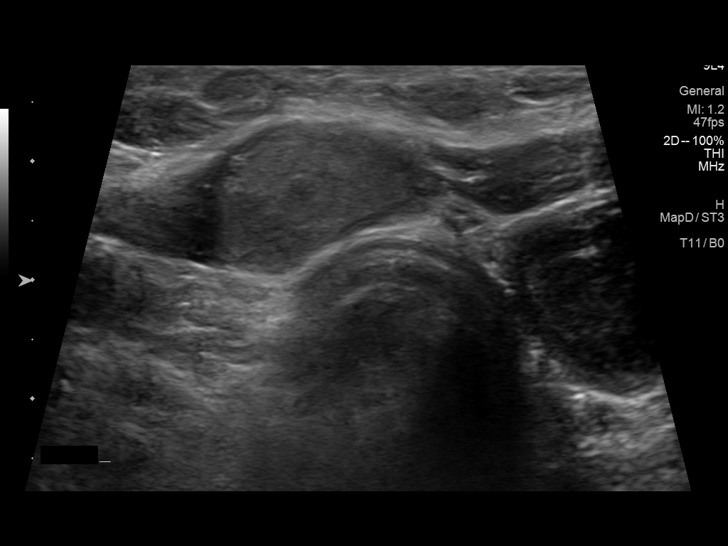
[im 22/22]
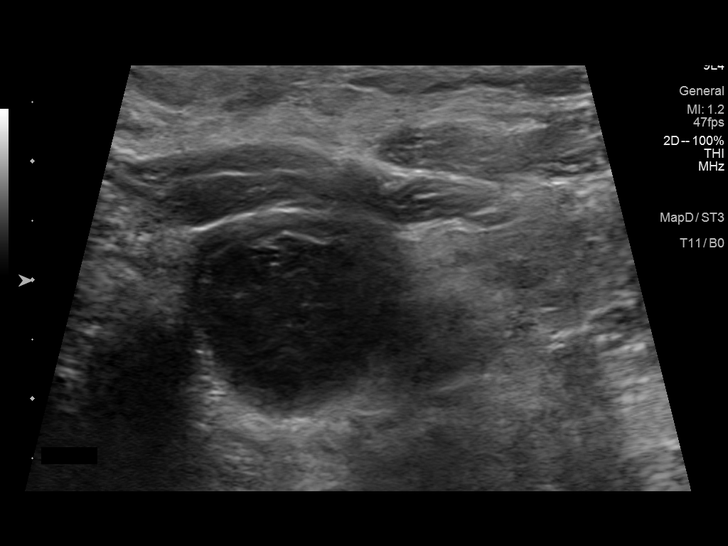

[13 of 22 positions shown; findings below may reference images not displayed]

Pre-procedural ultrasound scanning demonstrated unchanged size and
appearance of the indeterminate nodules within the Isthmus and left
thyroid

The procedure was planned. The neck was prepped in the usual sterile
fashion, and a sterile drape was applied covering the operative
field. A timeout was performed prior to the initiation of the
procedure. Local anesthesia was provided with 1% lidocaine.

Under direct ultrasound guidance, 5 FNA biopsies were performed of
the Isthmus nodule with a 27 gauge needle.

2 of these samples were obtained for AFIRMA

Multiple ultrasound images were saved for procedural documentation
purposes. The samples were prepared and submitted to pathology.

Under direct ultrasound guidance, 5 FNA biopsies were performed of
the left Inferior thyroid nodule with one 25 gauge needle and 4 27
gauge needles.

2 of these samples were obtained for AFIRMA

Multiple ultrasound images were saved for procedural documentation
purposes. The samples were prepared and submitted to pathology.

Limited post procedural scanning was negative for hematoma or
additional complication. Dressings were placed. The patient
tolerated the above procedures procedure well without immediate
postprocedural complication.
FINDINGS: Nodule reference number based on prior diagnostic ultrasound: 1

Maximum size: 2.2 cm

Location: Isthmus; Mid

ACR TI-RADS risk category: TR4 (4-6 points)

Reason for biopsy: meets ACR TI-RADS criteria

_________________________________________________________

Nodule reference number based on prior diagnostic ultrasound: 3

Maximum size: 3.3 cm

Location: Left; Inferior

ACR TI-RADS risk category: TR4 (4-6 points)

Reason for biopsy: meets ACR TI-RADS criteria

Ultrasound imaging confirms appropriate placement of the needles
within the thyroid nodule.
IMPRESSION: 1. Technically successful ultrasound guided fine needle aspiration
of Isthmus thyroid nodule
2. Technically successful ultrasound guided fine needle aspiration
of Left inferior thyroid nodule

Read by

Norka Tepper

## 2023-01-27 IMAGING — US US FNA BIOPSY THYROID 1ST LESION
1 series · 13 of 22 positions shown · non-contrast
Comparison: US Thyroid 12/04/20

MEDICATIONS:
10 cc 1% lidocaine

COMPLICATIONS:
None immediate.

INDICATION: Isthmus nodule: 2.2 cm

Left inferior thyroid nodule: 3.3 cm
EXAM:
ULTRASOUND GUIDED FINE NEEDLE ASPIRATION OF INDETERMINATE THYROID
NODULE
TECHNIQUE: Informed written consent was obtained from the patient after a
discussion of the risks, benefits and alternatives to treatment.
Questions regarding the procedure were encouraged and answered. A
timeout was performed prior to the initiation of the procedure.

[Series 1: us fna biopsy thyroid 1st lesion · 0.06mm/px · 22 acquisitions, 13 frames shown]
[im 1/22]
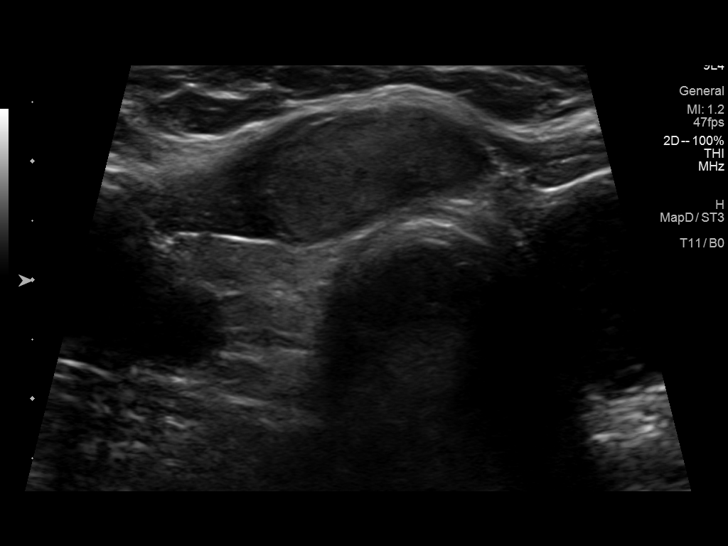
[im 3/22]
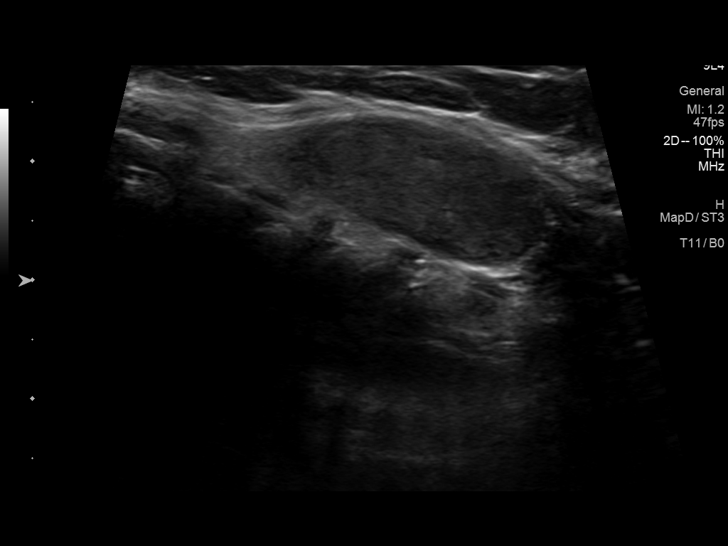
[im 5/22]
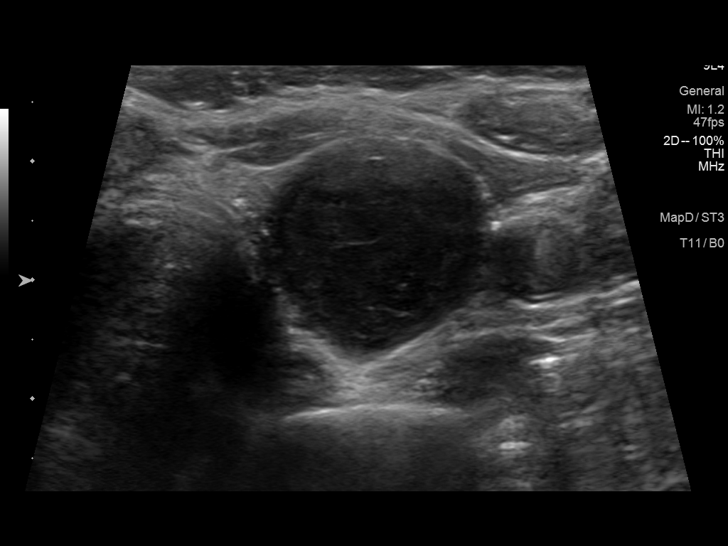
[im 6/22]
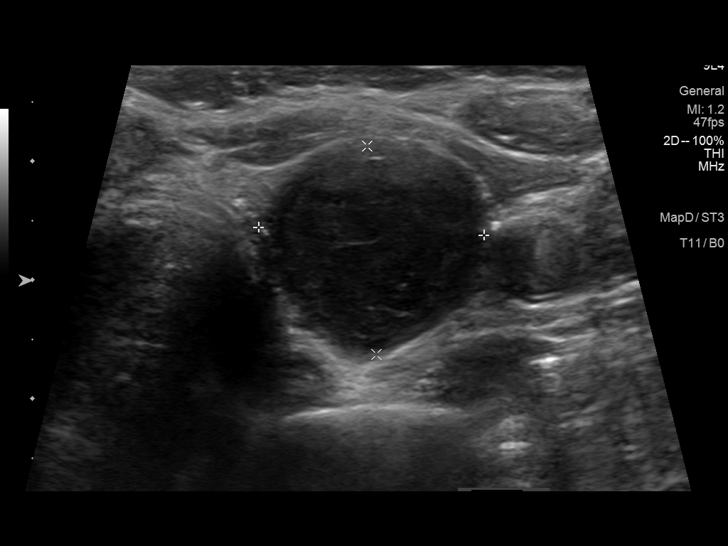
[im 8/22]
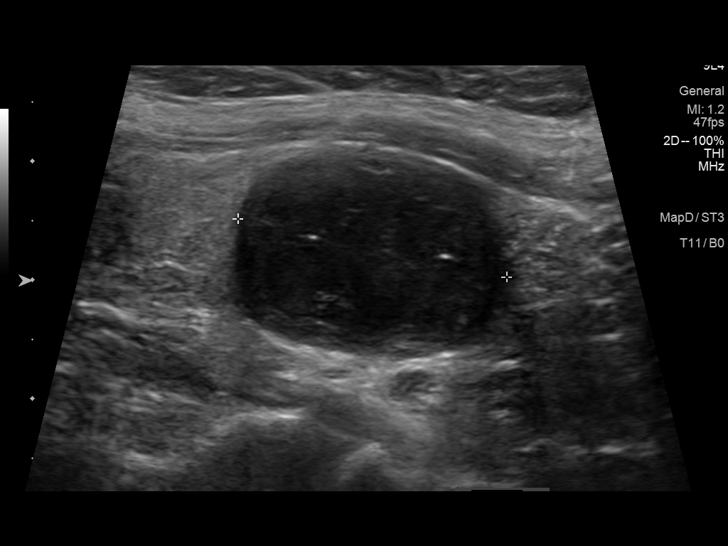
[im 10/22]
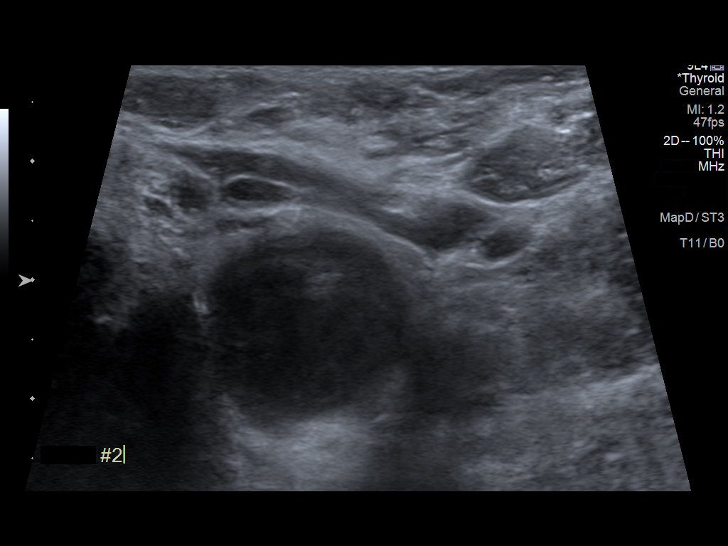
[im 12/22]
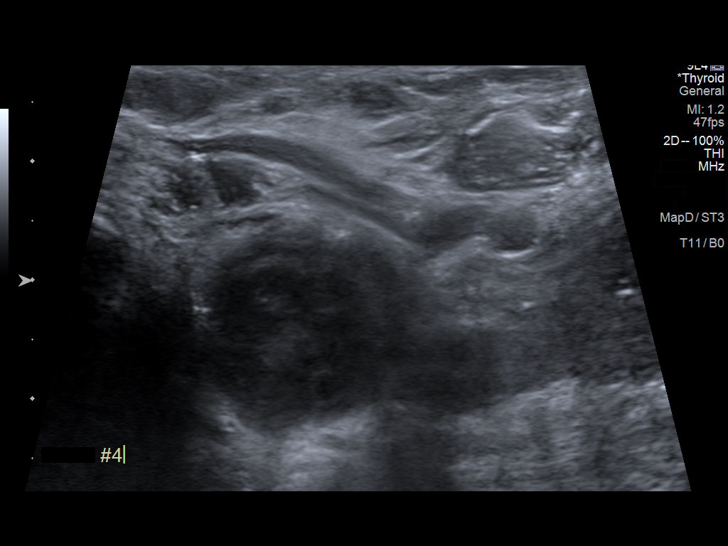
[im 13/22]
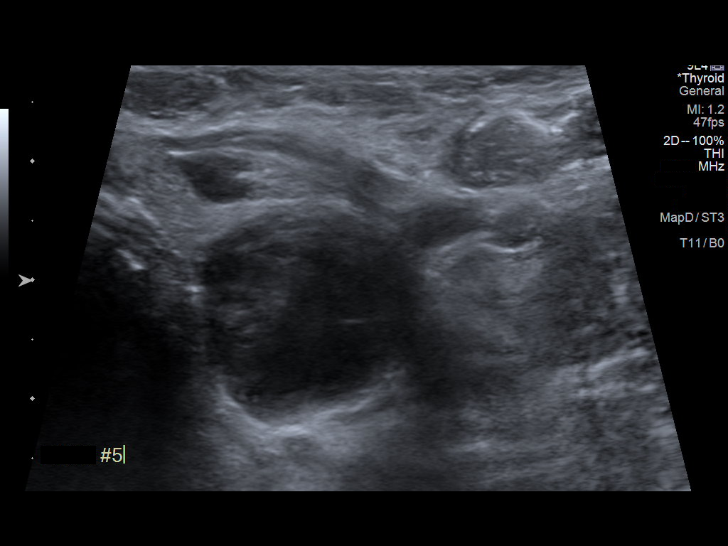
[im 15/22]
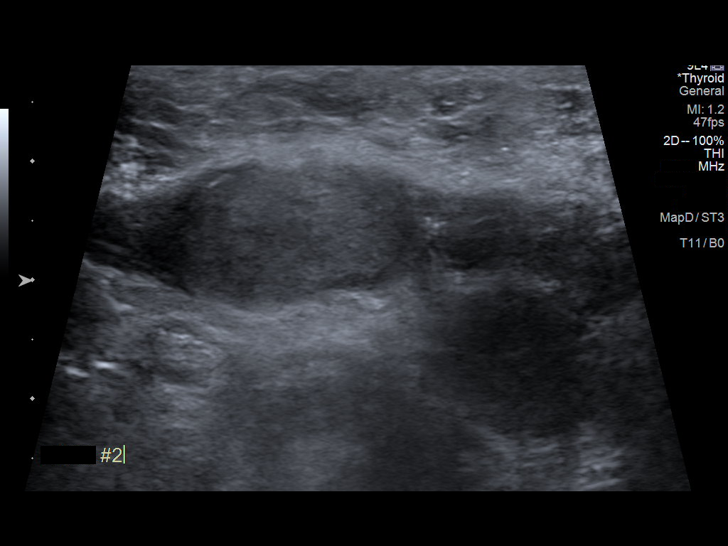
[im 17/22]
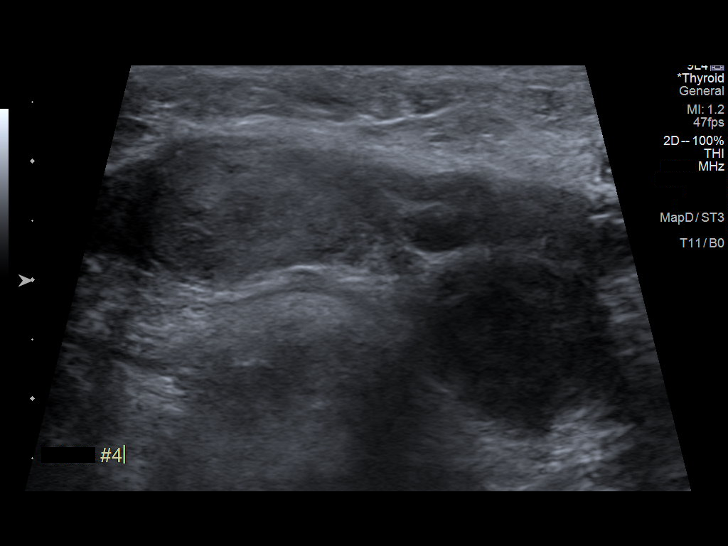
[im 18/22]
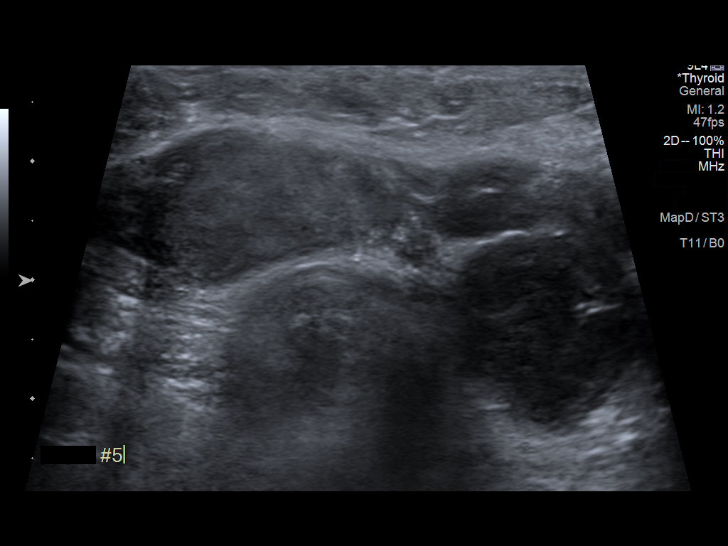
[im 20/22]
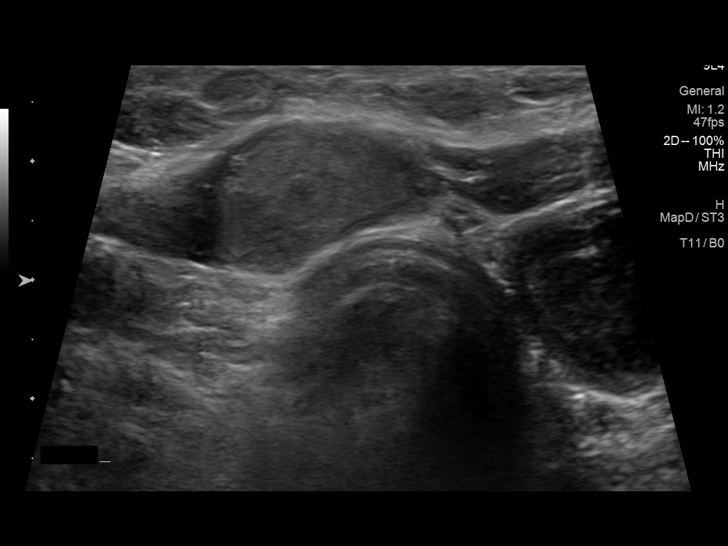
[im 22/22]
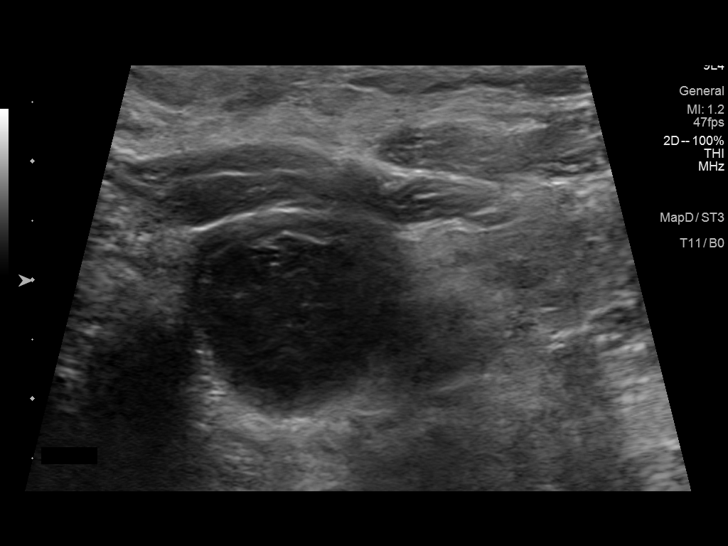

[13 of 22 positions shown; findings below may reference images not displayed]

Pre-procedural ultrasound scanning demonstrated unchanged size and
appearance of the indeterminate nodules within the Isthmus and left
thyroid

The procedure was planned. The neck was prepped in the usual sterile
fashion, and a sterile drape was applied covering the operative
field. A timeout was performed prior to the initiation of the
procedure. Local anesthesia was provided with 1% lidocaine.

Under direct ultrasound guidance, 5 FNA biopsies were performed of
the Isthmus nodule with a 27 gauge needle.

2 of these samples were obtained for AFIRMA

Multiple ultrasound images were saved for procedural documentation
purposes. The samples were prepared and submitted to pathology.

Under direct ultrasound guidance, 5 FNA biopsies were performed of
the left Inferior thyroid nodule with one 25 gauge needle and 4 27
gauge needles.

2 of these samples were obtained for AFIRMA

Multiple ultrasound images were saved for procedural documentation
purposes. The samples were prepared and submitted to pathology.

Limited post procedural scanning was negative for hematoma or
additional complication. Dressings were placed. The patient
tolerated the above procedures procedure well without immediate
postprocedural complication.
FINDINGS: Nodule reference number based on prior diagnostic ultrasound: 1

Maximum size: 2.2 cm

Location: Isthmus; Mid

ACR TI-RADS risk category: TR4 (4-6 points)

Reason for biopsy: meets ACR TI-RADS criteria

_________________________________________________________

Nodule reference number based on prior diagnostic ultrasound: 3

Maximum size: 3.3 cm

Location: Left; Inferior

ACR TI-RADS risk category: TR4 (4-6 points)

Reason for biopsy: meets ACR TI-RADS criteria

Ultrasound imaging confirms appropriate placement of the needles
within the thyroid nodule.
IMPRESSION: 1. Technically successful ultrasound guided fine needle aspiration
of Isthmus thyroid nodule
2. Technically successful ultrasound guided fine needle aspiration
of Left inferior thyroid nodule

Read by

Norka Tepper

## 2023-03-30 IMAGING — DX DG CHEST 2V
2 series · 2 of 2 positions shown · non-contrast
Comparison: None

CLINICAL DATA: Preoperative evaluation for total thyroidectomy

EXAM:
CHEST - 2 VIEW

[chest pa]
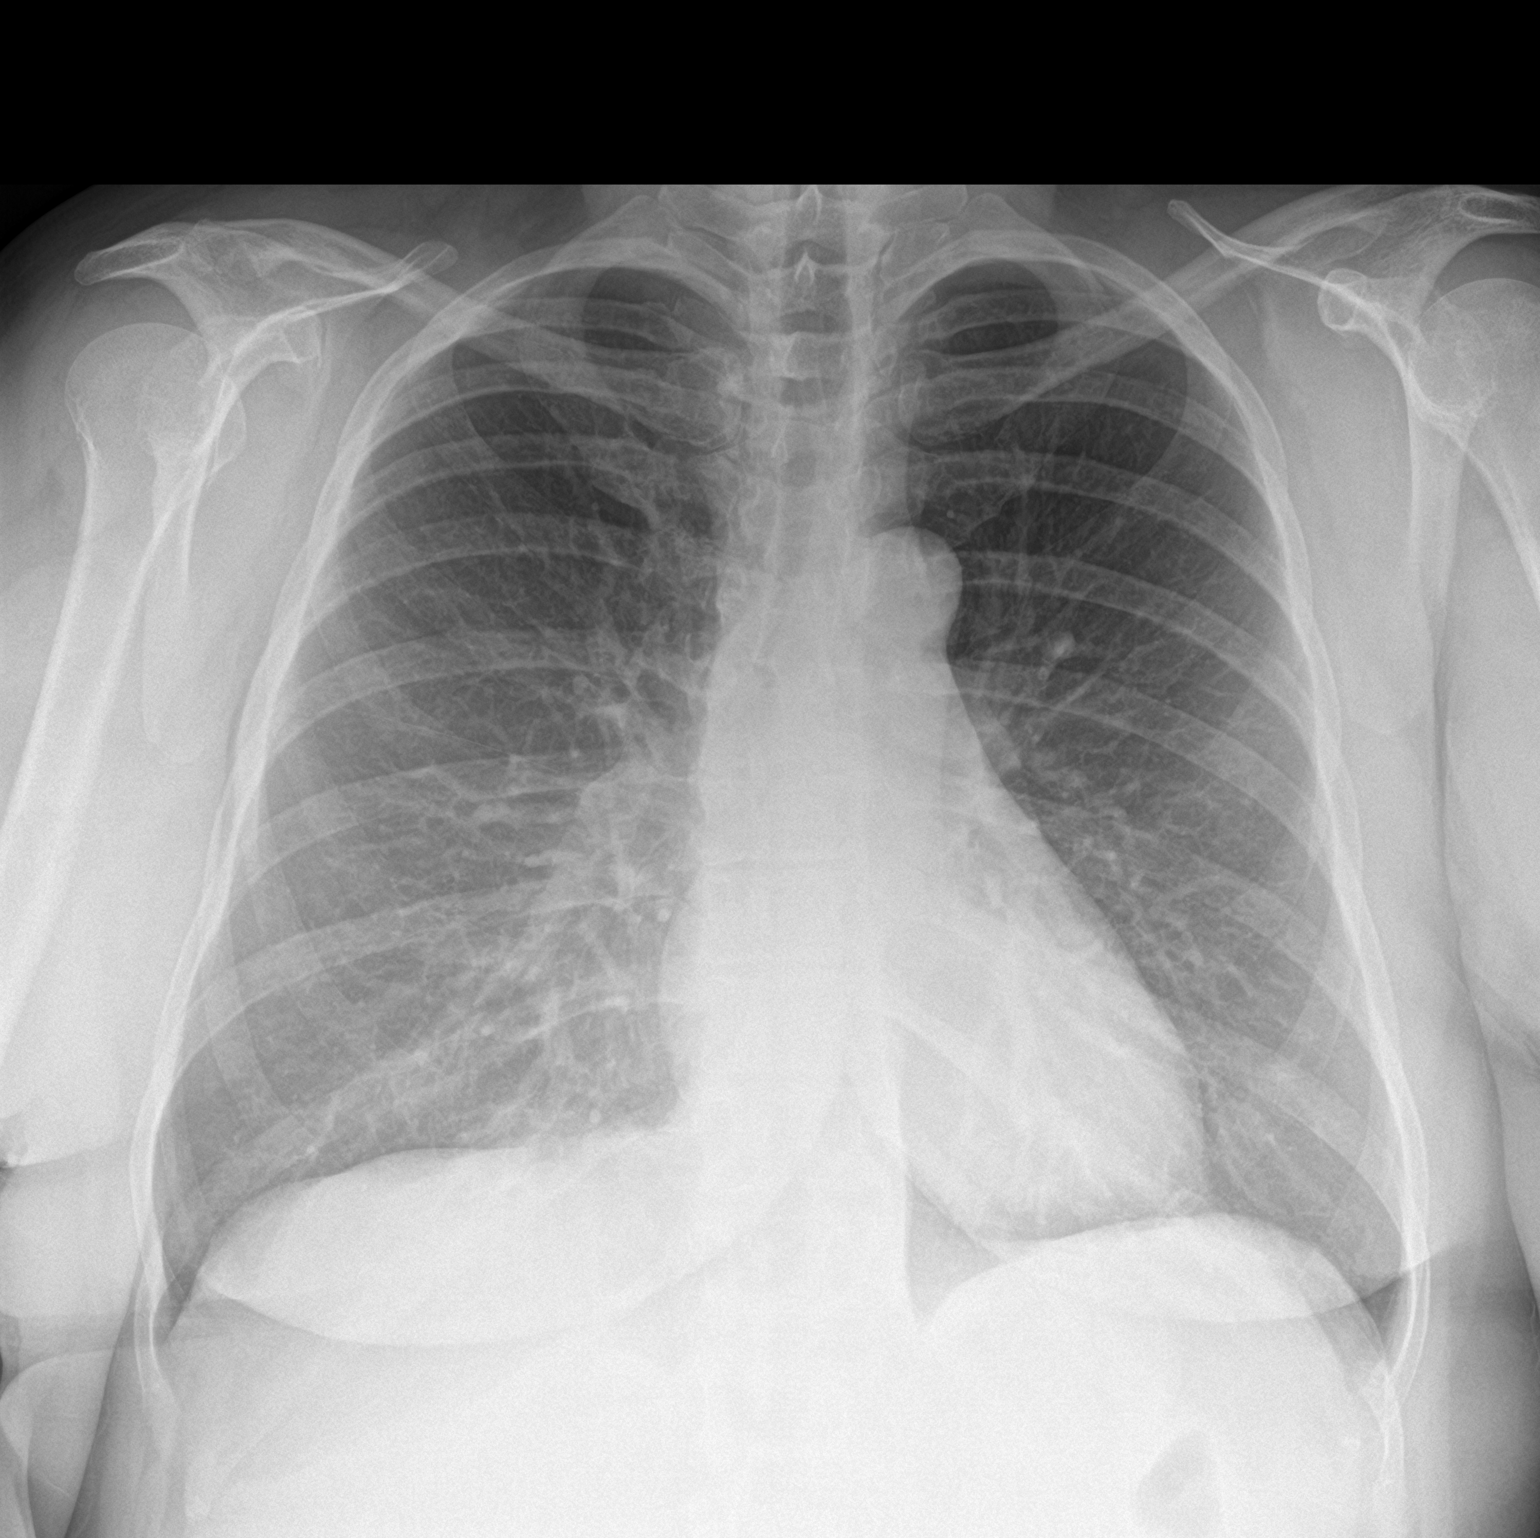

[chest lat]
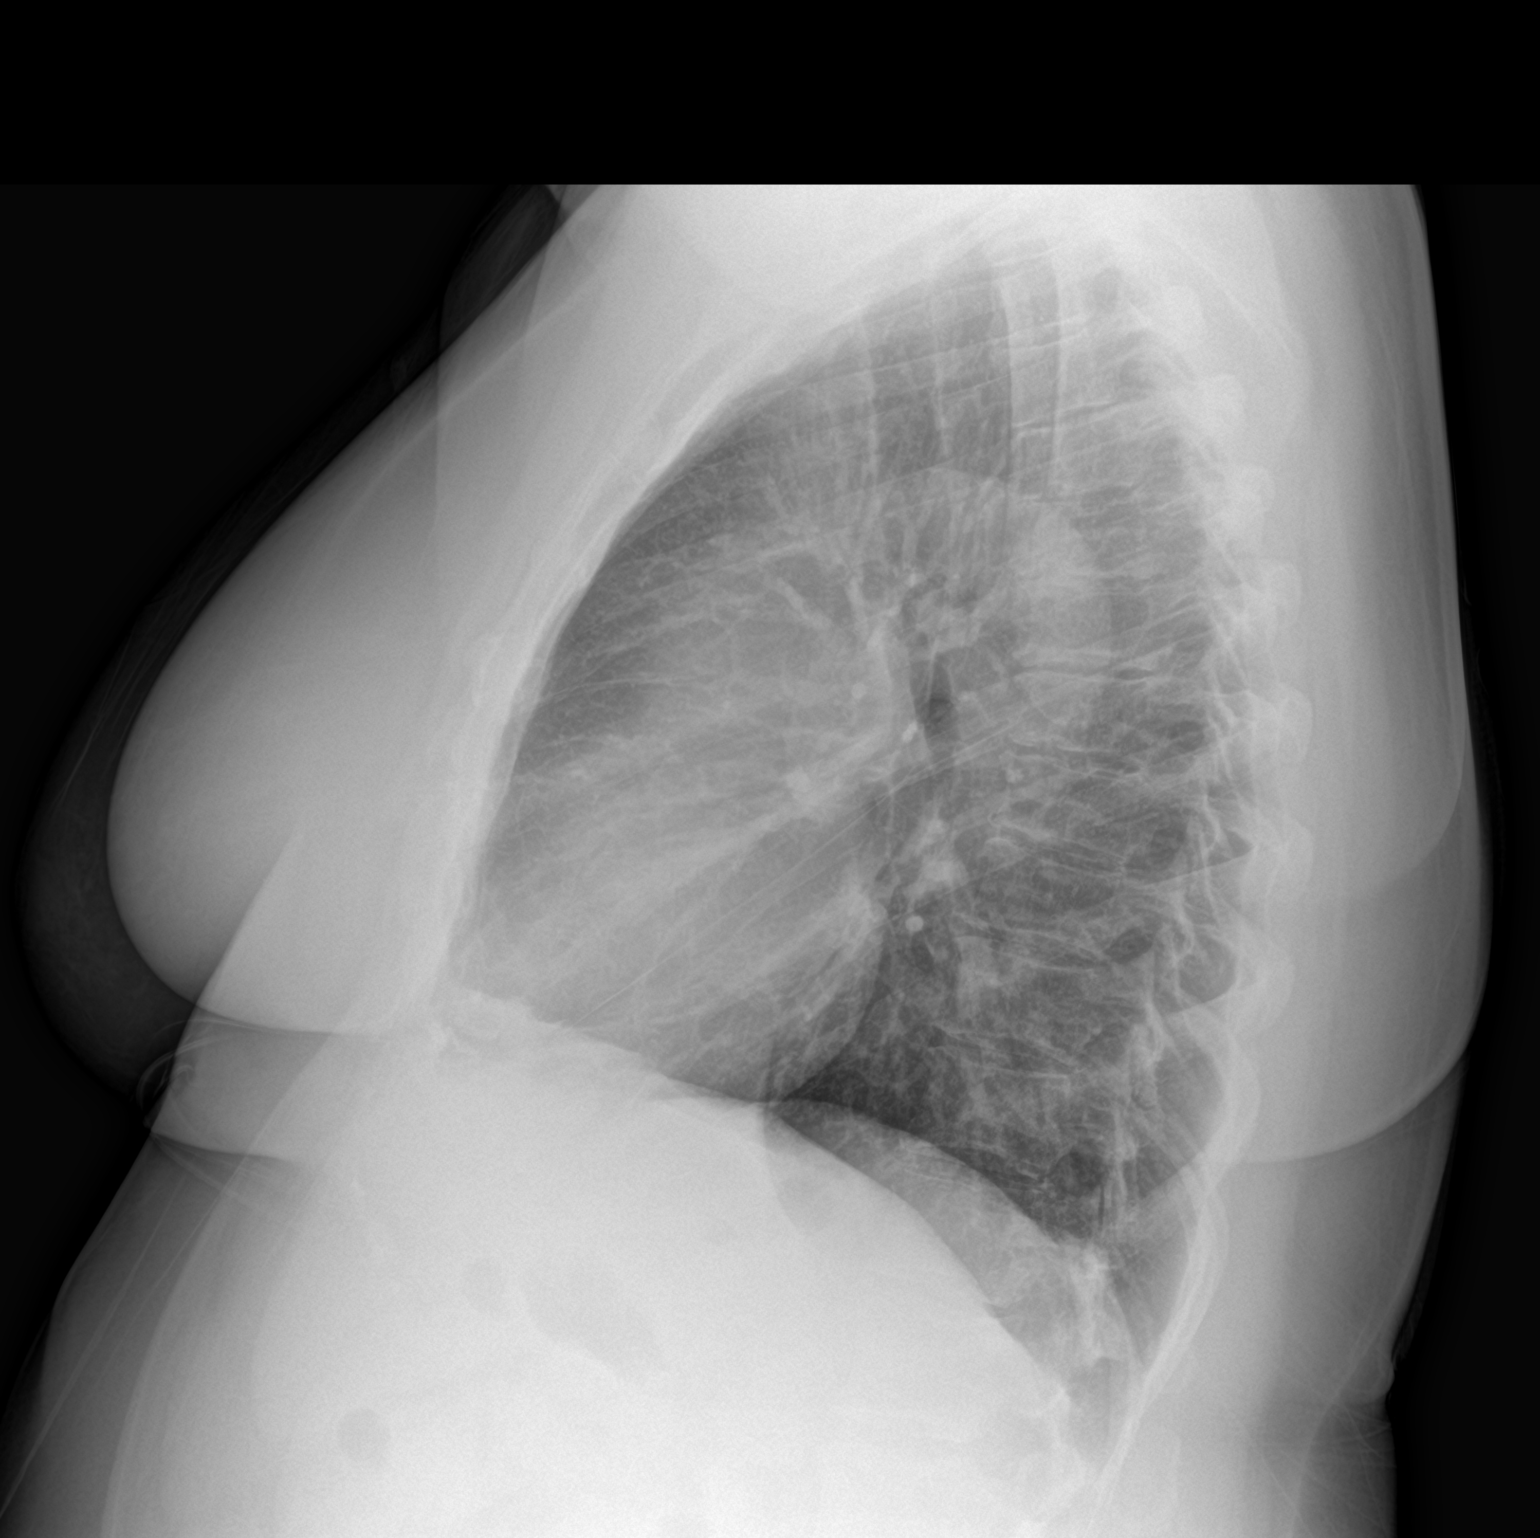

[2 of 2 positions shown; findings below may reference images not displayed]

FINDINGS: Normal heart size, mediastinal contours, and pulmonary vascularity.

Lungs clear.

No pleural effusion or pneumothorax.

Bones unremarkable.
IMPRESSION: Normal exam.

## 2023-08-04 DIAGNOSIS — L821 Other seborrheic keratosis: Secondary | ICD-10-CM | POA: Diagnosis not present

## 2023-08-04 DIAGNOSIS — D225 Melanocytic nevi of trunk: Secondary | ICD-10-CM | POA: Diagnosis not present

## 2023-08-04 DIAGNOSIS — L578 Other skin changes due to chronic exposure to nonionizing radiation: Secondary | ICD-10-CM | POA: Diagnosis not present

## 2023-08-04 DIAGNOSIS — L814 Other melanin hyperpigmentation: Secondary | ICD-10-CM | POA: Diagnosis not present

## 2023-11-17 DIAGNOSIS — R7303 Prediabetes: Secondary | ICD-10-CM | POA: Diagnosis not present

## 2023-11-17 DIAGNOSIS — E890 Postprocedural hypothyroidism: Secondary | ICD-10-CM | POA: Diagnosis not present

## 2023-11-17 DIAGNOSIS — Z0000 Encounter for general adult medical examination without abnormal findings: Secondary | ICD-10-CM | POA: Diagnosis not present

## 2023-12-01 ENCOUNTER — Ambulatory Visit: Admitting: Family Medicine

## 2024-02-02 DIAGNOSIS — Z01419 Encounter for gynecological examination (general) (routine) without abnormal findings: Secondary | ICD-10-CM | POA: Diagnosis not present

## 2024-02-02 DIAGNOSIS — Z1231 Encounter for screening mammogram for malignant neoplasm of breast: Secondary | ICD-10-CM | POA: Diagnosis not present

## 2024-03-01 DIAGNOSIS — E890 Postprocedural hypothyroidism: Secondary | ICD-10-CM | POA: Diagnosis not present
